# Patient Record
Sex: Male | Born: 1990 | Race: Black or African American | Hispanic: No | Marital: Single | Smoking: Never smoker
Health system: Southern US, Community
[De-identification: ages and names within clinical notes are randomized; demographics above are authoritative.]

## PROBLEM LIST (undated history)

## (undated) DIAGNOSIS — T7840XA Allergy, unspecified, initial encounter: Secondary | ICD-10-CM

## (undated) DIAGNOSIS — Z973 Presence of spectacles and contact lenses: Secondary | ICD-10-CM

## (undated) HISTORY — PX: WISDOM TOOTH EXTRACTION: SHX21

## (undated) HISTORY — DX: Presence of spectacles and contact lenses: Z97.3

## (undated) HISTORY — PX: TYMPANOSTOMY TUBE PLACEMENT: SHX32

## (undated) HISTORY — DX: Allergy, unspecified, initial encounter: T78.40XA

---
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
## 2010-02-13 ENCOUNTER — Ambulatory Visit: Payer: Self-pay

## 2010-02-21 ENCOUNTER — Ambulatory Visit
Admission: RE | Admit: 2010-02-21 | Discharge: 2010-02-21 | Payer: Self-pay | Source: Home / Self Care | Attending: Family Medicine | Admitting: Family Medicine

## 2010-03-22 ENCOUNTER — Encounter: Payer: Self-pay | Admitting: *Deleted

## 2010-03-22 NOTE — Progress Notes (Signed)
Summary: Shot Record  Phone Note Call from Patient Call back at Home Phone [phone]   Caller: mom-Trish Summary of Call: Pt is going to college is he up to date on shots if so can she get a print out of them. Initial call taken by: Clydell Hakim,  September 12, 2009 8:53 AM  Follow-up for Phone Call        message left to call back. patient needs Hep A, Hep B, Menactra, tdap and Varicella if he has never had the disease.  need to schedule nurse visit. Follow-up by: Theresia Lo RN,  September 12, 2009 11:33 AM  Additional Follow-up for Phone Call Additional follow up Details #1::        mother given above message and patient will come in Thursday to update immunizations. mother states he had chicken pox  disease at age 20 . Additional Follow-up by: Theresia Lo RN,  September 12, 2009 4:56 PM

## 2010-03-22 NOTE — Assessment & Plan Note (Signed)
Summary: SHOTS/KH  Nurse Visit   Vital Signs:  Patient profile:   20 year old male Temp:     98.6 degrees F  Vitals Entered By: Theresia Lo RN (February 21, 2010 9:30 AM)  Allergies: No Known Drug Allergies  Immunizations Administered:  Hepatitis B Vaccine # 1:    Vaccine Type: HepB Adolescent    Site: right deltoid    Mfr: Merck    Dose: 0.5 ml    Route: IM    Given by: Theresia Lo RN    Exp. Date: 06/18/2011    Lot #: [id number]    VIS given: 09/04/05 version given February 21, 2010.  Influenza Vaccine # 1:    Vaccine Type: Fluvax 3+    Site: left deltoid    Mfr: GlaxoSmithKline    Dose: 0.5 ml    Route: IM    Given by: Theresia Lo RN    Exp. Date: 08/18/2010    Lot #: [id number]    VIS given: 09/12/09 version given February 21, 2010.  Flu Vaccine Consent Questions:    Do you have a history of severe allergic reactions to this vaccine? no    Any prior history of allergic reactions to egg and/or gelatin? no    Do you have a sensitivity to the preservative Thimersol? no    Do you have a past history of Guillan-Barre Syndrome? no    Do you currently have an acute febrile illness? no    Have you ever had a severe reaction to latex? no    Vaccine information given and explained to patient? yes  Orders Added: 1)  Hepatitis B Vaccine ADOLESCENT (2 dose) [90743] 2)  Admin 1st Vaccine [90471] 3)  Flu Vaccine 53yrs + [14782] 4)  Admin of Any Addtl Vaccine [90472]   Orders Added: 1)  Hepatitis B Vaccine ADOLESCENT (2 dose) [90743] 2)  Admin 1st Vaccine [90471] 3)  Flu Vaccine 19yrs + [95621] 4)  Admin of Any Addtl Vaccine [30865]

## 2010-03-22 NOTE — Assessment & Plan Note (Signed)
Summary: update immunizations/ls  Nurse Visit  Hep B, Hep A Menactra, Tdap given. Entered in [place]. Theresia Lo RN  September 14, 2009 12:31 PM  Vital Signs:  Patient profile:   20 year old male Temp:     98.3 degrees F oral  Vitals Entered By: Theresia Lo RN (September 14, 2009 12:30 PM)  Allergies: No Known Drug Allergies  Orders Added: 1)  Admin 1st Vaccine [facility]) [90471S] 2)  Admin of Any Addtl Vaccine [facility]) [phone]

## 2010-03-22 NOTE — Assessment & Plan Note (Signed)
Summary: STD check   Vital Signs:  Patient profile:   20 year old male Height:      71.25 inches Weight:      170 pounds BMI:     23.63 Temp:     98.6 degrees F oral Pulse rate:   73 / minute BP sitting:   128 / 81  (right arm) Cuff size:   regular  Vitals Entered By: Jimmy Footman, CMA (September 21, 2009 9:58 AM) CC: STD checking Is Patient Diabetic? No Pain Assessment Patient in pain? no      Comments Pt. states that he has no sxs   Primary Care [facility] [place]:  Jamie Brookes MD  CC:  STD checking.  History of Present Illness: STD check: Pt is 20 y/o. He has had 4 sexual partners in his life. He does not always use condoms. He is going off to college in a few weeks and wants to have everything checked for STD's before he goes away to college. He has no h/o STD's.  He smokes occasional MJ, but not cigarettes. Discussed making sure he has all his vaccines before college. He just got them and is up to date.  No penile discharge, no pain, no c/o fevers or chills.   Habits & Providers  Alcohol-Tobacco-Diet     Tobacco Status: never  Exercise-Depression-Behavior     Drug Use: marijuanna  Comments: smokes MJ occasionally  Current Medications (verified): 1)  Zyrtec Allergy 10 Mg Tabs (Cetirizine Hcl) .Marland Kitchen.. 1 Tablet By Mouth At Bedtime As Needed For Allergies 2)  Ranitidine Hcl 150 Mg Caps (Ranitidine Hcl) .Marland Kitchen.. 1 Tablet By Mouth Two Times A Day For Heartburn / Reflux  Allergies (verified): No Known Drug Allergies  Social History: Going to college in [place], [place] soon. Will study business. Performs well in school.  Lives with mom.  Hopes to run Track.  Socially well adapted.  No tobacco or EtOH.  Occasional MJ.  Sexually active with h/o 4 partners. No using protection all the time.Drug Use:  marijuanna  Review of Systems        vitals reviewed and pertinent negatives and positives seen in HPI   Physical Exam  General:  Well-developed,well-nourished,in no acute distress;  alert,appropriate and cooperative throughout examination Genitalia:  Testes bilaterally descended without nodularity, tenderness or masses. No scrotal masses or lesions. No penis lesions or urethral discharge. Psych:  depressed affect and flat affect.     Impression & Recommendations:  Problem # 1:  CONTACT OR EXPOSURE TO OTHER VIRAL DISEASES (ICD-V01.79) Assessment New Pt has had 4 sexual partners. He has not always worn protection. He is here today to get tested for STD's   Orders: HIV-[facility] ([id number]) RPR-[facility] [phone]) [facility]- Est Level  3 (35573)  Complete Medication List: 1)  Zyrtec Allergy 10 Mg Tabs (Cetirizine hcl) .Marland Kitchen.. 1 tablet by mouth at bedtime as needed for allergies 2)  Ranitidine Hcl 150 Mg Caps (Ranitidine hcl) .Marland Kitchen.. 1 tablet by mouth two times a day for heartburn / reflux  Other Orders: GC/Chlamydia-[facility] ([id number]/87491)

## 2011-02-24 ENCOUNTER — Encounter: Payer: Self-pay | Admitting: *Deleted

## 2011-02-24 ENCOUNTER — Emergency Department (HOSPITAL_COMMUNITY)
Admission: EM | Admit: 2011-02-24 | Discharge: 2011-02-24 | Disposition: A | Discharge status: Home / Self Care | Payer: Self-pay | Attending: Emergency Medicine | Admitting: Emergency Medicine

## 2011-02-24 ENCOUNTER — Emergency Department (HOSPITAL_COMMUNITY): Payer: Self-pay

## 2011-02-24 ENCOUNTER — Other Ambulatory Visit: Payer: Self-pay

## 2011-02-24 DIAGNOSIS — M549 Dorsalgia, unspecified: Secondary | ICD-10-CM | POA: Insufficient documentation

## 2011-02-24 DIAGNOSIS — R0789 Other chest pain: Secondary | ICD-10-CM

## 2011-02-24 DIAGNOSIS — R079 Chest pain, unspecified: Secondary | ICD-10-CM | POA: Insufficient documentation

## 2011-02-24 MED ORDER — IBUPROFEN 800 MG PO TABS
800.0000 mg | ORAL_TABLET | Freq: Once | ORAL | Status: AC
  Administered 2011-02-24: 800 mg via ORAL
  Filled 2011-02-24: qty 1

## 2011-02-24 NOTE — ED Notes (Signed)
Cp started last night; difficult to move out of bed. Lt. Side cp. Lt. Shoulder pain.

## 2011-02-24 NOTE — ED Notes (Signed)
Meal tray provided to pt and family per request. Pt continues to rest quietly in bed. Denies pain. NAD noted. Awaiting disposition.

## 2011-02-24 NOTE — ED Provider Notes (Signed)
History     CSN: [id number]  Arrival date & time 02/24/11  4098   First MD Initiated Contact with Patient 02/24/11 1002      Chief Complaint  Patient presents with  . Chest Pain    (Consider location/radiation/quality/duration/timing/severity/associated sxs/prior treatment) Patient is a 21 y.o. male presenting with chest pain. The history is provided by the patient.  Chest Pain   He says that he woke up during the night and noted a sharp left-sided chest pain with some radiation to the back. Pain is worse with movement and slightly worse when he takes a deep breath. He rates the pain at 8/10 while lying still, and 10 out of 10 when he moves it. There is no associated dyspnea, nausea, vomiting, diaphoresis. He denies any recent unusual activity and denies any recent cold symptoms or cough. He denies fever, chills. He has not had similar pain before. Has not taken anything to help the pain.  History reviewed. No pertinent past medical history.  History reviewed. No pertinent past surgical history.  History reviewed. No pertinent family history.  History  Substance Use Topics  . Smoking status: Not on file  . Smokeless tobacco: Not on file  . Alcohol Use: No      Review of Systems  Cardiovascular: Positive for chest pain.  All other systems reviewed and are negative.    Allergies  Review of patient's allergies indicates no known allergies.  Home Medications  No current outpatient prescriptions on file.  BP 134/83  Pulse 85  Temp(Src) 97.8 F (36.6 C) (Oral)  SpO2 99%  Physical Exam  Nursing note and vitals reviewed.  21 year old male who is resting comfortably and in no acute distress. Vital signs are normal. Oxygen saturation is 99% which is normal. Head is normocephalic and atraumatic. PERRLA, EOMI. Neck is supple without adenopathy or JVD. Lungs are clear without rales, wheezes, rhonchi. Back is nontender. Heart has regular rate and rhythm without murmur. There  is moderate left anterior chest wall tenderness which does reproduce his pain. Abdomen is soft, flat, nontender without masses or hepatosplenomegaly. Extremities have no cyanosis, full range of motion present. Skin is warm and moist without rash. Neurologic: Mental status is normal, cranial nerves are intact, there no focal motor or sensory deficits. Psychiatric: No abnormalities of mood or affect.  ED Course  Procedures (including critical care time)  Labs Reviewed - No data to display No results found.  Results for orders placed in visit on 09/21/09  CONVERTED [facility] LAB      Component Value Range   Chlamydia, DNA Probe NEGATIVE  NEGATIVE    GC Probe Amp, Genital NEGATIVE  NEGATIVE    HIV NON REAC  NON REAC    RPR NON REAC  NON REAC    Dg Chest 2 View  02/24/2011  *RADIOLOGY REPORT*  Clinical Data: Chest pain, left shoulder pain  CHEST - 2 VIEW  Comparison: None.  Findings: Slight hyperinflation and minimal central airway thickening.  Prominent lower lobe vascular markings.  No definite pneumonia, collapse, consolidation, effusion, or pneumothorax. Trachea midline.  IMPRESSION: Hyperinflation and central bronchial thickening.  Original Report Authenticated By: Judie Petit. Ruel Favors, M.D.    No diagnosis found.   Date: 02/24/2011  Rate: 70  Rhythm: normal sinus rhythm  QRS Axis: normal  Intervals: normal  ST/T Wave abnormalities: early repolarization  Conduction Disutrbances:none  Narrative Interpretation: Left ventricular hypertrophy with early repolarization changes. No old tracing available for comparison.  Old EKG Reviewed:  none available  He was given a dose of oral ibuprofen with excellent relief of pain. Pain clearly seems to be chest wall related, possibly costochondritis. He will be sent home with instructions to use over-the-counter NSAIDs as needed.  MDM  Chest pain which appears to be musculoskeletal, possibly costochondritis. Chest x-ray will be obtained to rule out pneumonia  and will be given a trial of NSAIDs. Dose of ibuprofen is given in the ED.        Dione Booze, MD 02/24/11 [phone]

## 2013-05-25 ENCOUNTER — Ambulatory Visit (INDEPENDENT_AMBULATORY_CARE_PROVIDER_SITE_OTHER): Payer: 59 | Admitting: Medical

## 2013-05-25 ENCOUNTER — Encounter: Payer: Self-pay | Admitting: Medical

## 2013-05-25 VITALS — BP 110/60 | HR 88 | Temp 97.8°F | Resp 16 | Ht 71.5 in | Wt 174.0 lb

## 2013-05-25 DIAGNOSIS — J309 Allergic rhinitis, unspecified: Secondary | ICD-10-CM

## 2013-05-25 DIAGNOSIS — Z113 Encounter for screening for infections with a predominantly sexual mode of transmission: Secondary | ICD-10-CM

## 2013-05-25 DIAGNOSIS — Z0000 Encounter for general adult medical examination without abnormal findings: Secondary | ICD-10-CM

## 2013-05-25 DIAGNOSIS — Z841 Family history of disorders of kidney and ureter: Secondary | ICD-10-CM

## 2013-05-25 LAB — COMPREHENSIVE METABOLIC PANEL
ALK PHOS: 58 U/L (ref 39–117)
ALT: 18 U/L (ref 0–53)
AST: 16 U/L (ref 0–37)
Albumin: 4.4 g/dL (ref 3.5–5.2)
BILIRUBIN TOTAL: 0.9 mg/dL (ref 0.2–1.2)
BUN: 10 mg/dL (ref 6–23)
CO2: 34 mEq/L — ABNORMAL HIGH (ref 19–32)
CREATININE: 0.97 mg/dL (ref 0.50–1.35)
Calcium: 9.6 mg/dL (ref 8.4–10.5)
Chloride: 101 mEq/L (ref 96–112)
Glucose, Bld: 82 mg/dL (ref 70–99)
Potassium: 4.1 mEq/L (ref 3.5–5.3)
Sodium: 139 mEq/L (ref 135–145)
Total Protein: 7.2 g/dL (ref 6.0–8.3)

## 2013-05-25 LAB — LIPID PANEL
CHOL/HDL RATIO: 2.5 ratio
Cholesterol: 136 mg/dL (ref 0–200)
HDL: 55 mg/dL (ref >39)
LDL Cholesterol: 66 mg/dL (ref 0–99)
TRIGLYCERIDES: 76 mg/dL (ref <150)
VLDL: 15 mg/dL (ref 0–40)

## 2013-05-25 LAB — CBC
HCT: 42.8 % (ref 39.0–52.0)
HEMOGLOBIN: 15 g/dL (ref 13.0–17.0)
MCH: 31.4 pg (ref 26.0–34.0)
MCHC: 35 g/dL (ref 30.0–36.0)
MCV: 89.7 fL (ref 78.0–100.0)
Platelets: 215 10*3/uL (ref 150–400)
RBC: 4.77 MIL/uL (ref 4.22–5.81)
RDW: 12.6 % (ref 11.5–15.5)
WBC: 7 10*3/uL (ref 4.0–10.5)

## 2013-05-25 NOTE — Progress Notes (Signed)
Subjective:   HPI  Connor Maldonado is a 23 y.o. male who presents for a complete physical.  New patient today.  Preventative care: Last ophthalmology visit:yes - [facility] Last dental visit:n/a  Last colonoscopy:n/a Last prostate exam: n/a Last EKG:n/a Last labs: new  Prior vaccinations: TD or Tdap:09/14/2009 Influenza:02/2010  Pneumococcal:n/a Shingles/Zostavax:n/a  Advanced directive:n/a Health care power of attorney:n/a Living will:n/a  Concerns: Thought he saw worm or mucous in stool once few months ago.   No current stool problems, diarrhea, weight loss, fever, or abdominal pain.  Reviewed their medical, surgical, family, social, medication, and allergy history and updated chart as appropriate.  Past Medical History  Diagnosis Date  . Wears contact lenses   . Allergy     Past Surgical History  Procedure Laterality Date  . Wisdom tooth extraction    . Tympanostomy tube placement      childhood    History   Social History  . Marital Status: Single    Spouse Name: N/A    Number of Children: N/A  . Years of Education: N/A   Occupational History  . Not on file.   Social History Main Topics  . Smoking status: Never Smoker   . Smokeless tobacco: Not on file  . Alcohol Use: 1.2 oz/week    2 Shots of liquor per week  . Drug Use: No  . Sexual Activity: Yes     Comment: same partner since high school   Other Topics Concern  . 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      Family History  Problem Relation Age of Onset  . Kidney disease Mother     kidney trasplant  . Diabetes Mother   . Hypertension Father   . Heart disease Maternal Grandfather   . Cancer Maternal Grandfather     prostate  . Stroke Maternal Grandfather   . Heart disease Paternal Grandfather   . Diabetes  Paternal Grandfather     Current outpatient prescriptions:cetirizine (ZYRTEC) 10 MG tablet, Take 10 mg by mouth daily., Disp: , Rfl:   No Known Allergies   Review of Systems Constitutional: -fever, -chills, -sweats, -unexpected weight change, -decreased appetite, -fatigue Allergy: -sneezing, -itching, -congestion Dermatology: -changing moles, --rash, -lumps ENT: -runny nose, -ear pain, -sore throat, -hoarseness, -sinus pain, -teeth pain, - ringing in ears, -hearing loss, -nosebleeds Cardiology: -chest pain, -palpitations, -swelling, -difficulty breathing when lying flat, -waking up short of breath Respiratory: -cough, -shortness of breath, -difficulty breathing with exercise or exertion, -wheezing, -coughing up blood Gastroenterology: -abdominal pain, -nausea, -vomiting, -diarrhea, -constipation, -blood in stool, -changes in bowel movement, -difficulty swallowing or eating Hematology: -bleeding, -bruising  Musculoskeletal: -joint aches, -muscle aches, -joint swelling, -back pain, -neck pain, -cramping, -changes in gait Ophthalmology: denies vision changes, eye redness, itching, discharge Urology: -burning with urination, -difficulty urinating, -blood in urine, -urinary frequency, -urgency, -incontinence Neurology: -headache, -weakness, -tingling, -numbness, -memory loss, -falls, -dizziness Psychology: -depressed mood, -agitation, -sleep problems     Objective:   Physical Exam  BP 110/60  Pulse 88  Temp(Src) 97.8 F (36.6 C) (Oral)  Resp 16  Ht 5' 11.5" (1.816 m)  Wt 174 lb (78.926 kg)  BMI 23.93 kg/m2  General appearance: alert, no distress, WD/WN, lean AA male Skin:scattered benign appearing macules, no worrisome lesions HEENT: normocephalic, conjunctiva/corneas  normal, sclerae anicteric, PERRLA, EOMi, left TM with scar tissue from prior TM tube, nares with turbintaed edema, clear discharge, pharynx normal Oral cavity: MMM, tongue normal, teeth normal Neck: supple, no  lymphadenopathy, no thyromegaly, no masses, normal ROM, no briuts Chest: non tender, normal shape and expansion Heart: RRR, normal S1, S2, no murmurs Lungs: CTA bilaterally, no wheezes, rhonchi, or rales Abdomen: +bs, soft, non tender, non distended, no masses, no hepatomegaly, no splenomegaly, no bruits Back: non tender, normal ROM, no scoliosis Musculoskeletal: upper extremities non tender, no obvious deformity, normal ROM throughout, lower extremities non tender, no obvious deformity, normal ROM throughout Extremities: no edema, no cyanosis, no clubbing Pulses: 2+ symmetric, upper and lower extremities, normal cap refill Neurological: alert, oriented x 3, CN2-12 intact, strength normal upper extremities and lower extremities, sensation normal throughout, DTRs 2+ throughout, no cerebellar signs, gait normal Psychiatric: normal affect, behavior normal, pleasant  GU: normal male external genitalia, circumcised, nontender, no masses, no hernia, no lymphadenopathy Rectal: deferred   Assessment and Plan :      Encounter Diagnoses  Name Primary?  . Routine general medical examination at a health care facility Yes  . Allergic rhinitis   . Screen for STD (sexually transmitted disease)   . Family history of kidney disease     Physical exam - discussed healthy lifestyle, diet, exercise, preventative care, vaccinations, and addressed their concerns.     Specific recommendations today include:  Continue routine exercise, healthy diet  See dentist yearly for routine preventative care  See eye doctor yearly for routine followup  Check your testicles monthly for lumps and bumps for testicular cancer screening  We are checking baseline labs today and we will call with these results  Continue Zyrtec daily at bedtime for allergies, but if this not helping after 1-2 weeks we can consider other options  Try and get me a copy of your college vaccine record  I do recommend updating the HPV  vaccine if you have not had this  Get yearly flu shot in September  If you continue to see abnormal stool findings, then recheck   Follow-up pending labs

## 2013-05-25 NOTE — Patient Instructions (Signed)
Thank you for giving me the opportunity to serve you today.    Your diagnosis today includes: Encounter Diagnoses  Name Primary?  . Routine general medical examination at a health care facility Yes  . Allergic rhinitis   . Screen for STD (sexually transmitted disease)   . Family history of kidney disease      Specific recommendations today include:  Continue routine exercise, healthy diet  See dentist yearly for routine preventative care  See eye doctor yearly for routine followup  Check your testicles monthly for lumps and bumps for testicular cancer screening  We are checking baseline labs today and we will call with these results  Continue Zyrtec daily at bedtime for allergies, but if this not helping after 1-2 weeks we can consider other options  Try and get me a copy of your college vaccine record  I do recommend updating the HPV vaccine if you have not had this  Get yearly flu shot in September  If you continue to see abnormal stool findings, then recheck   Return pending labs.    I have included other useful information below for your review.  Preventative Care for Adults, Male       REGULAR HEALTH EXAMS:  A routine yearly physical is a good way to check in with your primary care provider about your health and preventive screening. It is also an opportunity to share updates about your health and any concerns you have, and receive a thorough all-over exam.   Most health insurance companies pay for at least some preventative services.  Check with your health plan for specific coverages.  WHAT PREVENTATIVE SERVICES DO MEN NEED?  Adult men should have their weight and blood pressure checked regularly.   Men age 20 and older should have their cholesterol levels checked regularly.  Beginning at age 18 and continuing to age 80, men should be screened for colorectal cancer.  Certain people should may need continued testing until age 71.  Other cancer  screening may include exams for testicular and prostate cancer.  Updating vaccinations is part of preventative care.  Vaccinations help protect against diseases such as the flu.  Lab tests are generally done as part of preventative care to screen for anemia and blood disorders, to screen for problems with the kidneys and liver, to screen for bladder problems, to check blood sugar, and to check your cholesterol level.  Preventative services generally include counseling about diet, exercise, avoiding tobacco, drugs, excessive alcohol consumption, and sexually transmitted infections.    GENERAL RECOMMENDATIONS FOR GOOD HEALTH:  Healthy diet:  Eat a variety of foods, including fruit, vegetables, animal or vegetable protein, such as meat, fish, chicken, and eggs, or beans, lentils, tofu, and grains, such as rice.  Drink plenty of water daily.  Decrease saturated fat in the diet, avoid lots of red meat, processed foods, sweets, fast foods, and fried foods.  Exercise:  Aerobic exercise helps maintain good heart health. At least 30-40 minutes of moderate-intensity exercise is recommended. For example, a brisk walk that increases your heart rate and breathing. This should be done on most days of the week.   Find a type of exercise or a variety of exercises that you enjoy so that it becomes a part of your daily life.  Examples are running, walking, swimming, water aerobics, and biking.  For motivation and support, explore group exercise such as aerobic class, spin class, Zumba, Yoga,or  martial arts, etc.    Set exercise goals  for yourself, such as a certain weight goal, walk or run in a race such as a 5k walk/run.  Speak to your primary care provider about exercise goals.  Disease prevention:  If you smoke or chew tobacco, find out from your caregiver how to quit. It can literally save your life, no matter how long you have been a tobacco user. If you do not use tobacco, never begin.   Maintain  a healthy diet and normal weight. Increased weight leads to problems with blood pressure and diabetes.   The Body Mass Index or BMI is a way of measuring how much of your body is fat. Having a BMI above 27 increases the risk of heart disease, diabetes, hypertension, stroke and other problems related to obesity. Your caregiver can help determine your BMI and based on it develop an exercise and dietary program to help you achieve or maintain this important measurement at a healthful level.  High blood pressure causes heart and blood vessel problems.  Persistent high blood pressure should be treated with medicine if weight loss and exercise do not work.   Fat and cholesterol leaves deposits in your arteries that can block them. This causes heart disease and vessel disease elsewhere in your body.  If your cholesterol is found to be high, or if you have heart disease or certain other medical conditions, then you may need to have your cholesterol monitored frequently and be treated with medication.   Ask if you should have a stress test if your history suggests this. A stress test is a test done on a treadmill that looks for heart disease. This test can find disease prior to there being a problem.  Avoid drinking alcohol in excess (more than two drinks per day).  Avoid use of street drugs. Do not share needles with anyone. Ask for professional help if you need assistance or instructions on stopping the use of alcohol, cigarettes, and/or drugs.  Brush your teeth twice a day with fluoride toothpaste, and floss once a day. Good oral hygiene prevents tooth decay and gum disease. The problems can be painful, unattractive, and can cause other health problems. Visit your dentist for a routine oral and dental check up and preventive care every 6-12 months.   Look at your skin regularly.  Use a mirror to look at your back. Notify your caregivers of changes in moles, especially if there are changes in shapes, colors,  a size larger than a pencil eraser, an irregular border, or development of new moles.  Safety:  Use seatbelts 100% of the time, whether driving or as a passenger.  Use safety devices such as hearing protection if you work in environments with loud noise or significant background noise.  Use safety glasses when doing any work that could send debris in to the eyes.  Use a helmet if you ride a bike or motorcycle.  Use appropriate safety gear for contact sports.  Talk to your caregiver about gun safety.  Use sunscreen with a SPF (or skin protection factor) of 15 or greater.  Lighter skinned people are at a greater risk of skin cancer. Don't forget to also wear sunglasses in order to protect your eyes from too much damaging sunlight. Damaging sunlight can accelerate cataract formation.   Practice safe sex. Use condoms. Condoms are used for birth control and to help reduce the spread of sexually transmitted infections (or STIs).  Some of the STIs are gonorrhea (the clap), chlamydia, syphilis, trichomonas, herpes, HPV (human  papilloma virus) and HIV (human immunodeficiency virus) which causes AIDS. The herpes, HIV and HPV are viral illnesses that have no cure. These can result in disability, cancer and death.   Keep carbon monoxide and smoke detectors in your home functioning at all times. Change the batteries every 6 months or use a model that plugs into the wall.   Vaccinations:  Stay up to date with your tetanus shots and other required immunizations. You should have a booster for tetanus every 10 years. Be sure to get your flu shot every year, since 5%-20% of the [place] population comes down with the flu. The flu vaccine changes each year, so being vaccinated once is not enough. Get your shot in the fall, before the flu season peaks.   Other vaccines to consider:  Pneumococcal vaccine to protect against certain types of pneumonia.  This is normally recommended for adults age 41 or older.  However, adults  younger than 23 years old with certain underlying conditions such as diabetes, heart or lung disease should also receive the vaccine.  Shingles vaccine to protect against Varicella Zoster if you are older than age 51, or younger than 23 years old with certain underlying illness.  Hepatitis A vaccine to protect against a form of infection of the liver by a virus acquired from food.  Hepatitis B vaccine to protect against a form of infection of the liver by a virus acquired from blood or body fluids, particularly if you work in health care.  If you plan to travel internationally, check with your local health department for specific vaccination recommendations.  Cancer Screening:  Most routine colon cancer screening begins at the age of 34. On a yearly basis, doctors may provide special easy to use take-home tests to check for hidden blood in the stool. Sigmoidoscopy or colonoscopy can detect the earliest forms of colon cancer and is life saving. These tests use a small camera at the end of a tube to directly examine the colon. Speak to your caregiver about this at age 37, when routine screening begins (and is repeated every 5 years unless early forms of pre-cancerous polyps or small growths are found).   At the age of 62 men usually start screening for prostate cancer every year. Screening may begin at a younger age for those with higher risk. Those at higher risk include African-Americans or having a family history of prostate cancer. There are two types of tests for prostate cancer:   Prostate-specific antigen (PSA) testing. Recent studies raise questions about prostate cancer using PSA and you should discuss this with your caregiver.   Digital rectal exam (in which your doctor's lubricated and gloved finger feels for enlargement of the prostate through the anus).   Screening for testicular cancer.  Do a monthly exam of your testicles. Gently roll each testicle between your thumb and fingers,  feeling for any abnormal lumps. The best time to do this is after a hot shower or bath when the tissues are looser. Notify your caregivers of any lumps, tenderness or changes in size or shape immediately.

## 2013-05-26 LAB — HIV ANTIBODY (ROUTINE TESTING W REFLEX): HIV 1&2 Ab, 4th Generation: NONREACTIVE

## 2013-05-26 LAB — GC/CHLAMYDIA PROBE AMP
CT Probe RNA: NEGATIVE
GC Probe RNA: NEGATIVE

## 2013-05-26 LAB — RPR

## 2013-10-11 ENCOUNTER — Emergency Department (HOSPITAL_COMMUNITY)
Admission: EM | Admit: 2013-10-11 | Discharge: 2013-10-11 | Disposition: A | Discharge status: Home / Self Care | Payer: 59 | Attending: Emergency Medicine | Admitting: Emergency Medicine

## 2013-10-11 ENCOUNTER — Encounter (HOSPITAL_COMMUNITY): Payer: Self-pay | Admitting: Emergency Medicine

## 2013-10-11 DIAGNOSIS — S199XXA Unspecified injury of neck, initial encounter: Secondary | ICD-10-CM

## 2013-10-11 DIAGNOSIS — Y9389 Activity, other specified: Secondary | ICD-10-CM | POA: Diagnosis not present

## 2013-10-11 DIAGNOSIS — Z79899 Other long term (current) drug therapy: Secondary | ICD-10-CM | POA: Diagnosis not present

## 2013-10-11 DIAGNOSIS — Y92410 Unspecified street and highway as the place of occurrence of the external cause: Secondary | ICD-10-CM | POA: Diagnosis not present

## 2013-10-11 DIAGNOSIS — S0993XA Unspecified injury of face, initial encounter: Secondary | ICD-10-CM | POA: Diagnosis not present

## 2013-10-11 DIAGNOSIS — S0990XA Unspecified injury of head, initial encounter: Secondary | ICD-10-CM | POA: Insufficient documentation

## 2013-10-11 MED ORDER — HYDROCODONE-ACETAMINOPHEN 5-325 MG PO TABS: 1.0000 | ORAL_TABLET | Freq: Four times a day (QID) | ORAL | Status: DC | PRN

## 2013-10-11 NOTE — Discharge Instructions (Signed)
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
## 2013-10-12 NOTE — ED Provider Notes (Signed)
Medical screening examination/treatment/procedure(s) were performed by non-physician practitioner and as supervising physician I was immediately available for consultation/collaboration.   EKG Interpretation None        Mirian Mo, MD 10/12/13 0120

## 2013-12-20 ENCOUNTER — Emergency Department (HOSPITAL_COMMUNITY)
Admission: EM | Admit: 2013-12-20 | Discharge: 2013-12-20 | Disposition: A | Discharge status: Home / Self Care | Payer: No Typology Code available for payment source | Attending: Emergency Medicine | Admitting: Emergency Medicine

## 2013-12-20 ENCOUNTER — Emergency Department (HOSPITAL_COMMUNITY): Payer: No Typology Code available for payment source

## 2013-12-20 ENCOUNTER — Encounter (HOSPITAL_COMMUNITY): Payer: Self-pay

## 2013-12-20 DIAGNOSIS — Y9389 Activity, other specified: Secondary | ICD-10-CM | POA: Insufficient documentation

## 2013-12-20 DIAGNOSIS — Y92410 Unspecified street and highway as the place of occurrence of the external cause: Secondary | ICD-10-CM | POA: Diagnosis not present

## 2013-12-20 DIAGNOSIS — S6992XA Unspecified injury of left wrist, hand and finger(s), initial encounter: Secondary | ICD-10-CM | POA: Diagnosis present

## 2013-12-20 DIAGNOSIS — S63502A Unspecified sprain of left wrist, initial encounter: Secondary | ICD-10-CM | POA: Diagnosis not present

## 2013-12-20 DIAGNOSIS — S60511A Abrasion of right hand, initial encounter: Secondary | ICD-10-CM | POA: Insufficient documentation

## 2013-12-20 DIAGNOSIS — T148XXA Other injury of unspecified body region, initial encounter: Secondary | ICD-10-CM

## 2013-12-20 MED ORDER — IBUPROFEN 600 MG PO TABS: 600.0000 mg | ORAL_TABLET | Freq: Four times a day (QID) | ORAL | Status: DC | PRN

## 2013-12-20 MED ORDER — IBUPROFEN 200 MG PO TABS
600.0000 mg | ORAL_TABLET | Freq: Once | ORAL | Status: AC
  Administered 2013-12-20: 600 mg via ORAL
  Filled 2013-12-20: qty 3

## 2013-12-20 NOTE — ED Notes (Signed)
Patient called to treatment room x1, no answer.  
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
## 2013-12-20 NOTE — Discharge Instructions (Signed)
Abrasion An abrasion is a cut or scrape of the skin. Abrasions do not extend through all layers of the skin and most heal within 10 days. It is important to care for your abrasion properly to prevent infection. CAUSES  Most abrasions are caused by falling on, or gliding across, the ground or other surface. When your skin rubs on something, the outer and inner layer of skin rubs off, causing an abrasion. DIAGNOSIS  Your caregiver will be able to diagnose an abrasion during a physical exam.  TREATMENT  Your treatment depends on how large and deep the abrasion is. Generally, your abrasion will be cleaned with water and a mild soap to remove any dirt or debris. An antibiotic ointment may be put over the abrasion to prevent an infection. A bandage (dressing) may be wrapped around the abrasion to keep it from getting dirty.  You may need a tetanus shot if:  You cannot remember when you had your last tetanus shot.  You have never had a tetanus shot.  The injury broke your skin. If you get a tetanus shot, your arm may swell, get red, and feel warm to the touch. This is common and not a problem. If you need a tetanus shot and you choose not to have one, there is a rare chance of getting tetanus. Sickness from tetanus can be serious.  HOME CARE INSTRUCTIONS   If a dressing was applied, change it at least once a day or as directed by your caregiver. If the bandage sticks, soak it off with warm water.   Wash the area with water and a mild soap to remove all the ointment 2 times a day. Rinse off the soap and pat the area dry with a clean towel.   Reapply any ointment as directed by your caregiver. This will help prevent infection and keep the bandage from sticking. Use gauze over the wound and under the dressing to help keep the bandage from sticking.   Change your dressing right away if it becomes wet or dirty.   Only take over-the-counter or prescription medicines for pain, discomfort, or fever as  directed by your caregiver.   Follow up with your caregiver within 24-48 hours for a wound check, or as directed. If you were not given a wound-check appointment, look closely at your abrasion for redness, swelling, or pus. These are signs of infection. SEEK IMMEDIATE MEDICAL CARE IF:   You have increasing pain in the wound.   You have redness, swelling, or tenderness around the wound.   You have pus coming from the wound.   You have a fever or persistent symptoms for more than 2-3 days.  You have a fever and your symptoms suddenly get worse.  You have a bad smell coming from the wound or dressing.  MAKE SURE YOU:   Understand these instructions.  Will watch your condition.  Will get help right away if you are not doing well or get worse. Document Released: 11/14/2004 Document Revised: 01/22/2012 Document Reviewed: 01/08/2011 [facility] Patient Information 2015 [place], [place]. This information is not intended to replace advice given to you by your health care provider. Make sure you discuss any questions you have with your health care provider.  Cryotherapy Cryotherapy means treatment with cold. Ice or gel packs can be used to reduce both pain and swelling. Ice is the most helpful within the first 24 to 48 hours after an injury or flare-up from overusing a muscle or joint. Sprains, strains, spasms,  burning pain, shooting pain, and aches can all be eased with ice. Ice can also be used when recovering from surgery. Ice is effective, has very few side effects, and is safe for most people to use. PRECAUTIONS  Ice is not a safe treatment option for people with:  Raynaud phenomenon. This is a condition affecting small blood vessels in the extremities. Exposure to cold may cause your problems to return.  Cold hypersensitivity. There are many forms of cold hypersensitivity, including:  Cold urticaria. Red, itchy hives appear on the skin when the tissues begin to warm after being  iced.  Cold erythema. This is a red, itchy rash caused by exposure to cold.  Cold hemoglobinuria. Red blood cells break down when the tissues begin to warm after being iced. The hemoglobin that carry oxygen are passed into the urine because they cannot combine with blood proteins fast enough.  Numbness or altered sensitivity in the area being iced. If you have any of the following conditions, do not use ice until you have discussed cryotherapy with your caregiver:  Heart conditions, such as arrhythmia, angina, or chronic heart disease.  High blood pressure.  Healing wounds or open skin in the area being iced.  Current infections.  Rheumatoid arthritis.  Poor circulation.  Diabetes. Ice slows the blood flow in the region it is applied. This is beneficial when trying to stop inflamed tissues from spreading irritating chemicals to surrounding tissues. However, if you expose your skin to cold temperatures for too long or without the proper protection, you can damage your skin or nerves. Watch for signs of skin damage due to cold. HOME CARE INSTRUCTIONS Follow these tips to use ice and cold packs safely.  Place a dry or damp towel between the ice and skin. A damp towel will cool the skin more quickly, so you may need to shorten the time that the ice is used.  For a more rapid response, add gentle compression to the ice.  Ice for no more than 10 to 20 minutes at a time. The bonier the area you are icing, the less time it will take to get the benefits of ice.  Check your skin after 5 minutes to make sure there are no signs of a poor response to cold or skin damage.  Rest 20 minutes or more between uses.  Once your skin is numb, you can end your treatment. You can test numbness by very lightly touching your skin. The touch should be so light that you do not see the skin dimple from the pressure of your fingertip. When using ice, most people will feel these normal sensations in this order:  cold, burning, aching, and numbness.  Do not use ice on someone who cannot communicate their responses to pain, such as small children or people with dementia. HOW TO MAKE AN ICE PACK Ice packs are the most common way to use ice therapy. Other methods include ice massage, ice baths, and cryosprays. Muscle creams that cause a cold, tingly feeling do not offer the same benefits that ice offers and should not be used as a substitute unless recommended by your caregiver. To make an ice pack, do one of the following:  Place crushed ice or a bag of frozen vegetables in a sealable plastic bag. Squeeze out the excess air. Place this bag inside another plastic bag. Slide the bag into a pillowcase or place a damp towel between your skin and the bag.  Mix 3 parts water with  1 part rubbing alcohol. Freeze the mixture in a sealable plastic bag. When you remove the mixture from the freezer, it will be slushy. Squeeze out the excess air. Place this bag inside another plastic bag. Slide the bag into a pillowcase or place a damp towel between your skin and the bag. SEEK MEDICAL CARE IF:  You develop white spots on your skin. This may give the skin a blotchy (mottled) appearance.  Your skin turns blue or pale.  Your skin becomes waxy or hard.  Your swelling gets worse. MAKE SURE YOU:   Understand these instructions.  Will watch your condition.  Will get help right away if you are not doing well or get worse. Document Released: 10/01/2010 Document Revised: 06/21/2013 Document Reviewed: 10/01/2010 [facility] Patient Information 2015 [place], [place]. This information is not intended to replace advice given to you by your health care provider. Make sure you discuss any questions you have with your health care provider. Your xray is negative for fracture  You have been placed in a splint that you can wear for the next several days

## 2013-12-20 NOTE — ED Notes (Signed)
Ortho notified of pending orders 
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
## 2014-09-15 ENCOUNTER — Emergency Department (HOSPITAL_COMMUNITY)
Admission: EM | Admit: 2014-09-15 | Discharge: 2014-09-15 | Disposition: A | Discharge status: Home / Self Care | Payer: 59 | Attending: Emergency Medicine | Admitting: Emergency Medicine

## 2014-09-15 ENCOUNTER — Encounter (HOSPITAL_COMMUNITY): Payer: Self-pay | Admitting: Emergency Medicine

## 2014-09-15 DIAGNOSIS — R339 Retention of urine, unspecified: Secondary | ICD-10-CM | POA: Insufficient documentation

## 2014-09-15 DIAGNOSIS — R110 Nausea: Secondary | ICD-10-CM | POA: Diagnosis not present

## 2014-09-15 DIAGNOSIS — K5900 Constipation, unspecified: Secondary | ICD-10-CM | POA: Diagnosis not present

## 2014-09-15 DIAGNOSIS — R338 Other retention of urine: Secondary | ICD-10-CM

## 2014-09-15 DIAGNOSIS — R1031 Right lower quadrant pain: Secondary | ICD-10-CM | POA: Diagnosis not present

## 2014-09-15 LAB — URINALYSIS, ROUTINE W REFLEX MICROSCOPIC
Glucose, UA: NEGATIVE mg/dL
Hgb urine dipstick: NEGATIVE
Ketones, ur: 15 mg/dL — AB
Leukocytes, UA: NEGATIVE
Nitrite: NEGATIVE
Protein, ur: NEGATIVE mg/dL
SPECIFIC GRAVITY, URINE: 1.034 — AB (ref 1.005–1.030)
UROBILINOGEN UA: 1 mg/dL (ref 0.0–1.0)
pH: 5.5 (ref 5.0–8.0)

## 2014-09-15 LAB — CBC WITH DIFFERENTIAL/PLATELET
Basophils Absolute: 0.1 10*3/uL (ref 0.0–0.1)
Basophils Relative: 1 % (ref 0–1)
EOS ABS: 0.1 10*3/uL (ref 0.0–0.7)
Eosinophils Relative: 2 % (ref 0–5)
HCT: 41.8 % (ref 39.0–52.0)
HEMOGLOBIN: 14.2 g/dL (ref 13.0–17.0)
Lymphocytes Relative: 44 % (ref 12–46)
Lymphs Abs: 1.6 10*3/uL (ref 0.7–4.0)
MCH: 31.1 pg (ref 26.0–34.0)
MCHC: 34 g/dL (ref 30.0–36.0)
MCV: 91.5 fL (ref 78.0–100.0)
Monocytes Absolute: 0.4 10*3/uL (ref 0.1–1.0)
Monocytes Relative: 10 % (ref 3–12)
Neutro Abs: 1.6 10*3/uL — ABNORMAL LOW (ref 1.7–7.7)
Neutrophils Relative %: 43 % (ref 43–77)
Platelets: 185 10*3/uL (ref 150–400)
RBC: 4.57 MIL/uL (ref 4.22–5.81)
RDW: 12.8 % (ref 11.5–15.5)
WBC: 3.8 10*3/uL — AB (ref 4.0–10.5)

## 2014-09-15 LAB — COMPREHENSIVE METABOLIC PANEL
ALK PHOS: 48 U/L (ref 38–126)
ALT: 17 U/L (ref 17–63)
AST: 23 U/L (ref 15–41)
Albumin: 4.3 g/dL (ref 3.5–5.0)
Anion gap: 7 (ref 5–15)
BUN: 7 mg/dL (ref 6–20)
CALCIUM: 9.3 mg/dL (ref 8.9–10.3)
CHLORIDE: 106 mmol/L (ref 101–111)
CO2: 28 mmol/L (ref 22–32)
Creatinine, Ser: 1.19 mg/dL (ref 0.61–1.24)
GFR calc Af Amer: >60 mL/min (ref >60)
GFR calc non Af Amer: >60 mL/min (ref >60)
GLUCOSE: 91 mg/dL (ref 65–99)
POTASSIUM: 3.8 mmol/L (ref 3.5–5.1)
SODIUM: 141 mmol/L (ref 135–145)
Total Bilirubin: 1.7 mg/dL — ABNORMAL HIGH (ref 0.3–1.2)
Total Protein: 7.1 g/dL (ref 6.5–8.1)

## 2014-09-15 LAB — LIPASE, BLOOD: Lipase: 19 U/L — ABNORMAL LOW (ref 22–51)

## 2014-09-15 MED ORDER — SODIUM CHLORIDE 0.9 % IV BOLUS (SEPSIS)
1000.0000 mL | Freq: Once | INTRAVENOUS | Status: AC
  Administered 2014-09-15: 1000 mL via INTRAVENOUS

## 2014-09-15 MED ORDER — ONDANSETRON HCL 4 MG/2ML IJ SOLN
4.0000 mg | Freq: Once | INTRAMUSCULAR | Status: DC
  Filled 2014-09-15: qty 2

## 2014-09-15 MED ORDER — MORPHINE SULFATE 4 MG/ML IJ SOLN
4.0000 mg | Freq: Once | INTRAMUSCULAR | Status: DC
  Filled 2014-09-15: qty 1

## 2014-09-15 NOTE — ED Notes (Signed)
Patient refused zofran and morphine.

## 2014-09-15 NOTE — Discharge Instructions (Signed)
Acute Urinary Retention The hospital will call you in a few days with STD results if they are positive. Follow up with your primary care physician. Return for any fever, or urinary retention. Acute urinary retention is the temporary inability to urinate. This is a common problem in older men. As men age their prostates become larger and block the flow of urine from the bladder. This is usually a problem that has come on gradually.  HOME CARE INSTRUCTIONS If you are sent home with a Foley catheter and a drainage system, you will need to discuss the best course of action with your health care provider. While the catheter is in, maintain a good intake of fluids. Keep the drainage bag emptied and lower than your catheter. This is so that contaminated urine will not flow back into your bladder, which could lead to a urinary tract infection. There are two main types of drainage bags. One is a large bag that usually is used at night. It has a good capacity that will allow you to sleep through the night without having to empty it. The second type is called a leg bag. It has a smaller capacity, so it needs to be emptied more frequently. However, the main advantage is that it can be attached by a leg strap and can go underneath your clothing, allowing you the freedom to move about or leave your home. Only take over-the-counter or prescription medicines for pain, discomfort, or fever as directed by your health care provider.  SEEK MEDICAL CARE IF:  You develop a low-grade fever.  You experience spasms or leakage of urine with the spasms. SEEK IMMEDIATE MEDICAL CARE IF:   You develop chills or fever.  Your catheter stops draining urine.  Your catheter falls out.  You start to develop increased bleeding that does not respond to rest and increased fluid intake. MAKE SURE YOU:  Understand these instructions.  Will watch your condition.  Will get help right away if you are not doing well or get  worse. Document Released: 05/13/2000 Document Revised: 02/09/2013 Document Reviewed: 07/16/2012 [facility] Patient Information 2015 [place], [place]. This information is not intended to replace advice given to you by your health care provider. Make sure you discuss any questions you have with your health care provider.

## 2014-09-15 NOTE — ED Provider Notes (Signed)
Medical screening examination/treatment/procedure(s) were conducted as a shared visit with non-physician practitioner(s) and myself.  I personally evaluated the patient during the encounter.  24 year old male with 2-3 days of decreased urine output. Also with little suprapubic pain. Has had this before and was related dehydration. Is constipated as well. However rectal exam was negative for any prostate tenderness or bogginess or fluctuance, testicular exam normal. Abdominal exam was benign without any evidence of peritonitis. Patient is well-appearing vital signs are normal. Plan is to check urine and for STDs and BUN and creatinine to ensure no obstructive nephropathy present. Unsure cause of symptoms however if all these are normal patient will likely be discharged follow up with primary doctor.   EKG Interpretation None        Marily Memos, MD 09/15/14 2045

## 2014-09-15 NOTE — ED Provider Notes (Signed)
CSN: [id number]     Arrival date & time 09/15/14  0901 History   First MD Initiated Contact with Patient 09/15/14 [phone]     Chief Complaint  Patient presents with  . Urinary Retention     (Consider location/radiation/quality/duration/timing/severity/associated sxs/prior Treatment) HPI Connor Maldonado is a 24 year old male who presents for abdominal pain and urinary retention for the past 5 days. He states he is able to urinate but it is not his usual flow. He states the abdominal pain began in the epigastrium and has now moved to the right lower quadrant. Associated symptoms are nausea and constipation. He states his rectum is painful during bowel movements. His last bowel movement was 2 days ago. He states he normally has a bowel movement multiple times a day. He also states that he had this once before after drinking liquor. He admits to drinking liquor on Saturday night before this began. He denies any drug use. He states his pain is 6 out of 10 now. He denies any fever, chills, chest pain, shortness of breath, vomiting, diarrhea, hematochezia, penile discharge, hematuria, dysuria. He denies being sexually active. He states his sexual orientation is heterosexual. He denies any previous history of STDs. Past Medical History  Diagnosis Date  . Wears contact lenses   . Allergy    Past Surgical History  Procedure Laterality Date  . Wisdom tooth extraction    . Tympanostomy tube placement      childhood   Family History  Problem Relation Age of Onset  . Kidney disease Mother     kidney trasplant  . Diabetes Mother   . Hypertension Father   . Heart disease Maternal Grandfather   . Cancer Maternal Grandfather     prostate  . Stroke Maternal Grandfather   . Heart disease Paternal Grandfather   . Diabetes Paternal Grandfather    History  Substance Use Topics  . Smoking status: Never Smoker   . Smokeless tobacco: Not on file  . Alcohol Use: 1.2 oz/week    2 Shots of liquor per week      Review of Systems  Constitutional: Negative for fever and chills.  Gastrointestinal: Positive for nausea, abdominal pain and constipation. Negative for vomiting, diarrhea and blood in stool.  Genitourinary: Positive for decreased urine volume. Negative for dysuria, flank pain, discharge, penile swelling, scrotal swelling, penile pain and testicular pain.  All other systems reviewed and are negative.     Allergies  Review of patient's allergies indicates no known allergies.  Home Medications   Prior to Admission medications   Medication Sig Start Date End Date Taking? Authorizing Provider  HYDROcodone-acetaminophen (NORCO/VICODIN) 5-325 MG per tablet Take 1-2 tablets by mouth every 6 (six) hours as needed for moderate pain or severe pain. 10/11/13   Roxy Horseman, PA-C  ibuprofen (ADVIL,MOTRIN) 600 MG tablet Take 1 tablet (600 mg total) by mouth every 6 (six) hours as needed. 12/20/13   Earley Favor, NP   BP 125/69 mmHg  Pulse 86  Temp(Src) 98 F (36.7 C) (Oral)  Resp 16  Ht 6' (1.829 m)  Wt 155 lb (70.308 kg)  BMI 21.02 kg/m2  SpO2 100% Physical Exam  Constitutional: He is oriented to person, place, and time. He appears well-developed and well-nourished.  HENT:  Head: Normocephalic and atraumatic.  Eyes: Conjunctivae are normal.  Neck: Neck supple.  Cardiovascular: Normal rate, regular rhythm and normal heart sounds.   Pulmonary/Chest: Effort normal and breath sounds normal. No respiratory distress. He has no wheezes.  He has no rales.  Abdominal: Soft. Normal appearance. He exhibits no distension and no mass. There is tenderness in the right lower quadrant. There is no rigidity, no rebound, no guarding and no CVA tenderness.    Mild right abdominal tenderness to palpation. No rebound or guarding. No CVA tenderness to palpation.  Genitourinary: Testes normal and penis normal. Circumcised.  GU exam: Chaperone present. No penile discharge or tenderness. No swelling or  tenderness to the testicles. Urethral swab was obtained.  Rectal exam: No tenderness or bogginess to the prostate. No rectal bleeding.  Musculoskeletal: Normal range of motion.  Neurological: He is alert and oriented to person, place, and time.  Skin: Skin is warm and dry.  Psychiatric: He has a normal mood and affect. His behavior is normal.    ED Course  Procedures (including critical care time) Labs Review Labs Reviewed  URINALYSIS, ROUTINE W REFLEX MICROSCOPIC (NOT AT [facility]) - Abnormal; Notable for the following:    Specific Gravity, Urine 1.034 (*)    Bilirubin Urine SMALL (*)    Ketones, ur 15 (*)    All other components within normal limits  CBC WITH DIFFERENTIAL/PLATELET - Abnormal; Notable for the following:    WBC 3.8 (*)    Neutro Abs 1.6 (*)    All other components within normal limits  COMPREHENSIVE METABOLIC PANEL - Abnormal; Notable for the following:    Total Bilirubin 1.7 (*)    All other components within normal limits  LIPASE, BLOOD - Abnormal; Notable for the following:    Lipase 19 (*)    All other components within normal limits  GC/CHLAMYDIA PROBE AMP ([place]) NOT AT [facility]    Imaging Review No results found.   EKG Interpretation None      MDM   Final diagnoses:  Acute urinary retention   Patient presents for decreased urinary flow and abdominal pain. He has no signs of prostatitis. This could be due to STDs. I have not treated the patient since he is able to urinate. He refused pain medications or nausea medication at this time. He has no UTI. His labs are unremarkable. His I did discuss that GC chlamydia results would take 4-5 days in the hospital would call him with the results if they were positive. I also discussed that they would treat him at that time if they were positive. I gave the patient return precautions. He can follow up with his PCP in 2 days. Patient verbally agrees with the plan.    Catha Gosselin, PA-C 09/15/14  1701  Marily Memos, MD 09/15/14 1734

## 2014-09-15 NOTE — ED Notes (Signed)
PA at bedside.

## 2014-09-15 NOTE — ED Notes (Signed)
Onset 4-5 days ago states when urinating does not come out full strength and feels like not draining bladder completely. States feels constipated last BM normal one day ago. General abdominal pain 6/10 dull.

## 2014-09-16 ENCOUNTER — Ambulatory Visit (INDEPENDENT_AMBULATORY_CARE_PROVIDER_SITE_OTHER): Payer: 59 | Admitting: Medical

## 2014-09-16 ENCOUNTER — Encounter: Payer: Self-pay | Admitting: Medical

## 2014-09-16 VITALS — BP 100/70 | HR 60 | Temp 97.5°F | Resp 16 | Wt 154.0 lb

## 2014-09-16 DIAGNOSIS — K5900 Constipation, unspecified: Secondary | ICD-10-CM

## 2014-09-16 DIAGNOSIS — E860 Dehydration: Secondary | ICD-10-CM | POA: Diagnosis not present

## 2014-09-16 DIAGNOSIS — R339 Retention of urine, unspecified: Secondary | ICD-10-CM | POA: Diagnosis not present

## 2014-09-16 LAB — GC/CHLAMYDIA PROBE AMP (~~LOC~~) NOT AT ARMC
CHLAMYDIA, DNA PROBE: NEGATIVE
Neisseria Gonorrhea: NEGATIVE

## 2014-09-16 MED ORDER — TAMSULOSIN HCL 0.4 MG PO CAPS: 0.4000 mg | ORAL_CAPSULE | Freq: Every day | ORAL | Status: DC

## 2014-09-16 NOTE — Progress Notes (Signed)
Subjective: Here for hospital f/u.  Went to the emergency dept yesterday for urinary retention .  Had labs, prostate exam, and IV fluids, was advised that he was dehydrated from drinking too much alcohol which was likely cause of the urinary retention.  He note no prior similar problem.   No prior prostate issue or UTI.  No recent injury or trauma.  He had been drinking several alcoholic drinks the day before the symptoms.  Denies penile discharge, urinary burning or blood, no fever.  He does have problems with bloating, constipation, hard stool.  No other aggravating or relieving factors. No other complaint.  Past Medical History  Diagnosis Date  . Wears contact lenses   . Allergy    ROS as in subjective   Objective: BP 100/70 mmHg  Pulse 60  Temp(Src) 97.5 F (36.4 C) (Tympanic)  Resp 16  Wt 154 lb (69.854 kg)  General appearance: alert, no distress, WD/WN Neck: supple, no lymphadenopathy, no thyromegaly, no masses Heart: RRR, normal S1, S2, no murmurs Lungs: CTA bilaterally, no wheezes, rhonchi, or rales Abdomen: +bs, soft, non tender, non distended, no masses, no hepatomegaly, no splenomegaly Pulses: 2+ symmetric, upper and lower extremities, normal cap refill GU deferred  Results for orders placed or performed during the hospital encounter of 09/15/14 (from the past 48 hour(s))  GC/Chlamydia probe amp ([place])not at [facility]     Status: None   Collection Time: 09/15/14 12:00 AM  Result Value Ref Range   Chlamydia Negative     Comment: Normal Reference Range - Negative   Neisseria gonorrhea Negative     Comment: Normal Reference Range - Negative  Urinalysis, Routine w reflex microscopic (not at [facility])     Status: Abnormal   Collection Time: 09/15/14  9:53 AM  Result Value Ref Range   Color, Urine YELLOW YELLOW   APPearance CLEAR CLEAR   Specific Gravity, Urine 1.034 (H) 1.005 - 1.030   pH 5.5 5.0 - 8.0   Glucose, UA NEGATIVE NEGATIVE mg/dL   Hgb urine dipstick  NEGATIVE NEGATIVE   Bilirubin Urine SMALL (A) NEGATIVE   Ketones, ur 15 (A) NEGATIVE mg/dL   Protein, ur NEGATIVE NEGATIVE mg/dL   Urobilinogen, UA 1.0 0.0 - 1.0 mg/dL   Nitrite NEGATIVE NEGATIVE   Leukocytes, UA NEGATIVE NEGATIVE    Comment: MICROSCOPIC NOT DONE ON URINES WITH NEGATIVE PROTEIN, BLOOD, LEUKOCYTES, NITRITE, OR GLUCOSE <1000 mg/dL.  CBC with Differential     Status: Abnormal   Collection Time: 09/15/14 10:10 AM  Result Value Ref Range   WBC 3.8 (L) 4.0 - 10.5 K/uL   RBC 4.57 4.22 - 5.81 MIL/uL   Hemoglobin 14.2 13.0 - 17.0 g/dL   HCT 41.8 39.0 - 52.0 %   MCV 91.5 78.0 - 100.0 fL   MCH 31.1 26.0 - 34.0 pg   MCHC 34.0 30.0 - 36.0 g/dL   RDW 12.8 11.5 - 15.5 %   Platelets 185 150 - 400 K/uL   Neutrophils Relative % 43 43 - 77 %   Neutro Abs 1.6 (L) 1.7 - 7.7 K/uL   Lymphocytes Relative 44 12 - 46 %   Lymphs Abs 1.6 0.7 - 4.0 K/uL   Monocytes Relative 10 3 - 12 %   Monocytes Absolute 0.4 0.1 - 1.0 K/uL   Eosinophils Relative 2 0 - 5 %   Eosinophils Absolute 0.1 0.0 - 0.7 K/uL   Basophils Relative 1 0 - 1 %   Basophils Absolute 0.1  0.0 - 0.1 K/uL  Comprehensive metabolic panel     Status: Abnormal   Collection Time: 09/15/14 10:10 AM  Result Value Ref Range   Sodium 141 135 - 145 mmol/L   Potassium 3.8 3.5 - 5.1 mmol/L   Chloride 106 101 - 111 mmol/L   CO2 28 22 - 32 mmol/L   Glucose, Bld 91 65 - 99 mg/dL   BUN 7 6 - 20 mg/dL   Creatinine, Ser 1.19 0.61 - 1.24 mg/dL   Calcium 9.3 8.9 - 10.3 mg/dL   Total Protein 7.1 6.5 - 8.1 g/dL   Albumin 4.3 3.5 - 5.0 g/dL   AST 23 15 - 41 U/L   ALT 17 17 - 63 U/L   Alkaline Phosphatase 48 38 - 126 U/L   Total Bilirubin 1.7 (H) 0.3 - 1.2 mg/dL   GFR calc non Af Amer >60 >60 mL/min   GFR calc Af Amer >60 >60 mL/min    Comment: (NOTE) The eGFR has been calculated using the CKD EPI equation. This calculation has not been validated in all clinical situations. eGFR's persistently <60 mL/min signify possible Chronic  Kidney Disease.    Anion gap 7 5 - 15  Lipase, blood     Status: Abnormal   Collection Time: 09/15/14 10:10 AM  Result Value Ref Range   Lipase 19 (L) 22 - 51 U/L      Assessment: Encounter Diagnoses  Name Primary?  . Urinary retention Yes  . Constipation, unspecified constipation type   . Dehydration     Plan: Reviewed ED report, labs.  Discussed symptoms, possible causes, treatment recommendations  F/u 2wk.   Patient Instructions  Recommendations  Stop alcohol for now, and limit this in the future  Get at least 64 oz water daily  Take OTC Stool Softener to help with bowel movements  Begin trial of Flomax medication once daily for the next week  Recheck in 2 weeks, but call sooner if fever, nausea, feeling sick

## 2014-09-16 NOTE — Patient Instructions (Signed)
Recommendations  Stop alcohol for now, and limit this in the future  Get at least 64 oz water daily  Take OTC Stool Softener to help with bowel movements  Begin trial of Flomax medication once daily for the next week  Recheck in 2 weeks, but call sooner if fever, nausea, feeling sick

## 2014-10-03 ENCOUNTER — Encounter: Payer: Self-pay | Admitting: Medical

## 2014-10-03 ENCOUNTER — Ambulatory Visit (INDEPENDENT_AMBULATORY_CARE_PROVIDER_SITE_OTHER): Payer: 59 | Admitting: Medical

## 2014-10-03 VITALS — BP 110/74 | HR 75 | Resp 18 | Ht 72.0 in | Wt 157.0 lb

## 2014-10-03 DIAGNOSIS — Z113 Encounter for screening for infections with a predominantly sexual mode of transmission: Secondary | ICD-10-CM | POA: Diagnosis not present

## 2014-10-03 DIAGNOSIS — Z0000 Encounter for general adult medical examination without abnormal findings: Secondary | ICD-10-CM | POA: Diagnosis not present

## 2014-10-03 DIAGNOSIS — R196 Halitosis: Secondary | ICD-10-CM

## 2014-10-03 DIAGNOSIS — Z23 Encounter for immunization: Secondary | ICD-10-CM

## 2014-10-03 LAB — TSH: TSH: 1.262 u[IU]/mL (ref 0.350–4.500)

## 2014-10-03 MED ORDER — OMEPRAZOLE 40 MG PO CPDR: 40.0000 mg | DELAYED_RELEASE_CAPSULE | Freq: Every day | ORAL | Status: DC

## 2014-10-03 NOTE — Addendum Note (Signed)
Addended by: Clint Bolder D on: 10/03/2014 05:17 PM   Modules accepted: Kipp Brood

## 2014-10-03 NOTE — Progress Notes (Signed)
Subjective:   HPI  Connor Maldonado is a 24 y.o. male who presents for a complete physical.   Concerns: Recent issue with urinary retention has resolved.  Has concerns about bad breath, thinks its related to certain foods.   Reviewed their medical, surgical, family, social, medication, and allergy history and updated chart as appropriate.  Past Medical History  Diagnosis Date  . Wears contact lenses   . Allergy     Past Surgical History  Procedure Laterality Date  . Wisdom tooth extraction    . Tympanostomy tube placement      childhood    Social History   Social History  . Marital Status: Single    Spouse Name: N/A  . Number of Children: N/A  . Years of Education: N/A   Occupational History  . Not on file.   Social History Main Topics  . Smoking status: Never Smoker   . Smokeless tobacco: Not on file  . Alcohol Use: 1.2 oz/week    2 Shots of liquor per week  . Drug Use: No  . Sexual Activity: Yes     Comment: same partner since high school   Other Topics Concern  . 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 Kidney disease Mother     kidney trasplant  . Diabetes Mother   . Hypertension Father   . Heart disease Maternal Grandfather   . Cancer Maternal Grandfather     prostate  . Stroke Maternal Grandfather   . Heart disease Paternal Grandfather   . Diabetes Paternal Grandfather      Current outpatient prescriptions:  .  ibuprofen (ADVIL,MOTRIN) 600 MG tablet, Take 1 tablet (600 mg total) by mouth every 6 (six) hours as needed. (Patient not taking: Reported on 10/03/2014), Disp: 30 tablet, Rfl: 0 .  omeprazole (PRILOSEC) 40 MG capsule, Take 1 capsule (40 mg total) by mouth daily., Disp: 30 capsule, Rfl: 0  No Known  Allergies   Review of Systems Constitutional: -fever, -chills, -sweats, -unexpected weight change, -decreased appetite, -fatigue Allergy: -sneezing, -itching, -congestion Dermatology: -changing moles, --rash, -lumps ENT: -runny nose, -ear pain, -sore throat, -hoarseness, -sinus pain, -teeth pain, - ringing in ears, -hearing loss, -nosebleeds Cardiology: -chest pain, -palpitations, -swelling, -difficulty breathing when lying flat, -waking up short of breath Respiratory: -cough, -shortness of breath, -difficulty breathing with exercise or exertion, -wheezing, -coughing up blood Gastroenterology: -abdominal pain, -nausea, -vomiting, -diarrhea, -constipation, -blood in stool, -changes in bowel movement, -difficulty swallowing or eating Hematology: -bleeding, -bruising  Musculoskeletal: -joint aches, -muscle aches, -joint swelling, -back pain, -neck pain, -cramping, -changes in gait Ophthalmology: denies vision changes, eye redness, itching, discharge Urology: -burning with urination, -difficulty urinating, -blood in urine, -urinary frequency, -urgency, -incontinence Neurology: -headache, -weakness, -tingling, -numbness, -memory loss, -falls, -dizziness Psychology: -depressed mood, -agitation, -sleep problems     Objective:   Physical Exam  BP 110/74 mmHg  Pulse 75  Resp 18  Ht 6' (1.829 m)  Wt 157 lb (71.215 kg)  BMI 21.29 kg/m2  SpO2 97%  General appearance: alert, no distress, WD/WN, lean AA male Skin:scattered benign appearing macules, no worrisome lesions HEENT: normocephalic, conjunctiva/corneas normal, sclerae anicteric, PERRLA, EOMi, left TM with scar tissue from prior TM tube, nares patent, no  discharge, pharynx normal Oral cavity: MMM, tongue normal, teeth normal Neck: supple, no lymphadenopathy, no thyromegaly, no masses, normal ROM, no bruits Chest: non tender, normal shape and expansion Heart: RRR, normal S1, S2, no murmurs Lungs: CTA bilaterally, no wheezes, rhonchi, or  rales Abdomen: +bs, soft, non tender, non distended, no masses, no hepatomegaly, no splenomegaly, no bruits Back: non tender, normal ROM, no scoliosis Musculoskeletal: upper extremities non tender, no obvious deformity, normal ROM throughout, lower extremities non tender, no obvious deformity, normal ROM throughout Extremities: no edema, no cyanosis, no clubbing Pulses: 2+ symmetric, upper and lower extremities, normal cap refill Neurological: alert, oriented x 3, CN2-12 intact, strength normal upper extremities and lower extremities, sensation normal throughout, DTRs 2+ throughout, no cerebellar signs, gait normal Psychiatric: normal affect, behavior normal, pleasant  GU: normal male external genitalia, circumcised, nontender, no masses, no hernia, no lymphadenopathy Rectal: deferred   Assessment and Plan :     Encounter Diagnoses  Name Primary?  . Encounter for health maintenance examination in adult Yes  . Need for Tdap vaccination   . Need for influenza vaccination   . Bad breath   . Screen for STD (sexually transmitted disease)    Physical exam - discussed healthy lifestyle, diet, exercise, preventative care, vaccinations, and addressed their concerns.   Counseled on the Tdap (tetanus, diptheria, and acellular pertussis) vaccine.  Vaccine information sheet given. Tdap vaccine given after consent obtained. Counseled on the influenza virus vaccine.  Vaccine information sheet given.  Influenza vaccine given after consent obtained. See your dentist yearly for routine dental care including hygiene visits twice yearly. See your eye doctor yearly for routine vision care. Bad breath - could be related to GERD, discussed preventative measures, food diary, begin trial of Omeprazole daily.   He has tried OTC H2 blockers.    Follow-up pending labs

## 2014-10-04 LAB — HIV ANTIBODY (ROUTINE TESTING W REFLEX): HIV: NONREACTIVE

## 2014-10-04 LAB — RPR

## 2014-10-11 DIAGNOSIS — Z23 Encounter for immunization: Secondary | ICD-10-CM | POA: Diagnosis not present

## 2014-10-11 DIAGNOSIS — Z0000 Encounter for general adult medical examination without abnormal findings: Secondary | ICD-10-CM | POA: Diagnosis not present

## 2014-10-11 NOTE — Addendum Note (Signed)
Addended by: Herminio Commons A on: 10/11/2014 04:32 PM   Modules accepted: Orders

## 2014-11-25 ENCOUNTER — Encounter: Payer: Self-pay | Admitting: Medical

## 2014-11-25 ENCOUNTER — Ambulatory Visit (INDEPENDENT_AMBULATORY_CARE_PROVIDER_SITE_OTHER): Payer: 59 | Admitting: Medical

## 2014-11-25 VITALS — BP 114/78 | HR 56 | Temp 98.3°F

## 2014-11-25 DIAGNOSIS — H938X3 Other specified disorders of ear, bilateral: Secondary | ICD-10-CM

## 2014-11-25 DIAGNOSIS — H9193 Unspecified hearing loss, bilateral: Secondary | ICD-10-CM | POA: Diagnosis not present

## 2014-11-25 DIAGNOSIS — H9313 Tinnitus, bilateral: Secondary | ICD-10-CM | POA: Diagnosis not present

## 2014-11-25 MED ORDER — PSEUDOEPHEDRINE HCL ER 120 MG PO TB12: 120.0000 mg | ORAL_TABLET | Freq: Two times a day (BID) | ORAL | Status: DC

## 2014-11-25 NOTE — Patient Instructions (Signed)
Symptoms and exam suggest inner ear fluid pressure   Recommendation:  Drink plenty of water  Begin Sudafed as directed for the next 5-7 days  Use some OTC Ibuprofen for pain, pressure, and inflammation in the sinus  Avoid sudden movements   You can use OTC Emetrol for nausea if needed  You can use OTC Antivert for vertigo symptoms if needed  If not much improved by Monday, call us back  Vertigo Vertigo means you feel like you or your surroundings are moving when they are not. Vertigo can be dangerous if it occurs when you are at work, driving, or performing difficult activities.  CAUSES  Vertigo occurs when there is a conflict of signals sent to your brain from the visual and sensory systems in your body. There are many different causes of vertigo, including:  Infections, especially in the inner ear.  A bad reaction to a drug or misuse of alcohol and medicines.  Withdrawal from drugs or alcohol.  Rapidly changing positions, such as lying down or rolling over in bed.  A migraine headache.  Decreased blood flow to the brain.  Increased pressure in the brain from a head injury, infection, tumor, or bleeding. SYMPTOMS  You may feel as though the world is spinning around or you are falling to the ground. Because your balance is upset, vertigo can cause nausea and vomiting. You may have involuntary eye movements (nystagmus). DIAGNOSIS  Vertigo is usually diagnosed by physical exam. If the cause of your vertigo is unknown, your caregiver may perform imaging tests, such as an MRI scan (magnetic resonance imaging). TREATMENT  Most cases of vertigo resolve on their own, without treatment. Depending on the cause, your caregiver may prescribe certain medicines. If your vertigo is related to body position issues, your caregiver may recommend movements or procedures to correct the problem. In rare cases, if your vertigo is caused by certain inner ear problems, you may need surgery. HOME  CARE INSTRUCTIONS   Follow your caregiver's instructions.  Avoid driving.  Avoid operating heavy machinery.  Avoid performing any tasks that would be dangerous to you or others during a vertigo episode.  Tell your caregiver if you notice that certain medicines seem to be causing your vertigo. Some of the medicines used to treat vertigo episodes can actually make them worse in some people. SEEK IMMEDIATE MEDICAL CARE IF:   Your medicines do not relieve your vertigo or are making it worse.  You develop problems with talking, walking, weakness, or using your arms, hands, or legs.  You develop severe headaches.  Your nausea or vomiting continues or gets worse.  You develop visual changes.  A family member notices behavioral changes.  Your condition gets worse. MAKE SURE YOU:  Understand these instructions.  Will watch your condition.  Will get help right away if you are not doing well or get worse.   This information is not intended to replace advice given to you by your health care provider. Make sure you discuss any questions you have with your health care provider.   Document Released: 11/14/2004 Document Revised: 04/29/2011 Document Reviewed: 05/30/2014 Elsevier Interactive Patient Education 2016 [employer].    [employer] Barotitis media is inflammation of your middle ear. This occurs when the auditory tube (eustachian tube) leading from the back of your nose (nasopharynx) to your eardrum is blocked. This blockage may result from a cold, environmental allergies, or an upper respiratory infection. Unresolved barotitis media may lead to damage or hearing loss (  barotrauma), which may become permanent. HOME CARE INSTRUCTIONS   Use medicines as recommended by your health care provider. Over-the-counter medicines will help unblock the canal and can help during times of air travel.  Do not put anything into your ears to clean or unplug them. Eardrops will not be  helpful.  Do not swim, dive, or fly until your health care provider says it is all right to do so. If these activities are necessary, chewing gum with frequent, forceful swallowing may help. It is also helpful to hold your nose and gently blow to pop your ears for equalizing pressure changes. This forces air into the eustachian tube.  Only take over-the-counter or prescription medicines for pain, discomfort, or fever as directed by your health care provider.  A decongestant may be helpful in decongesting the middle ear and make pressure equalization easier. SEEK MEDICAL CARE IF:  You experience a serious form of dizziness in which you feel as if the room is spinning and you feel nauseated (vertigo).  Your symptoms only involve one ear. SEEK IMMEDIATE MEDICAL CARE IF:   You develop a severe headache, dizziness, or severe ear pain.  You have bloody or pus-like drainage from your ears.  You develop a fever.  Your problems do not improve or become worse. MAKE SURE YOU:   Understand these instructions.  Will watch your condition.  Will get help right away if you are not doing well or get worse.   This information is not intended to replace advice given to you by your health care provider. Make sure you discuss any questions you have with your health care provider.   Document Released: 02/02/2000 Document Revised: 11/25/2012 Document Reviewed: 09/01/2012 Elsevier Interactive Patient Education [employer]

## 2014-11-25 NOTE — Progress Notes (Signed)
   Chief Complaint  Patient presents with  . ear pain    both ears have been ringing. left ear he is not hearing well out of. started last week around monday or tuesday. no other concerns   Patient c/o 1+ week hx/o bilat ear pressure, occasional ringing in the ears, some dizziness at times, nausea.    Symptoms include ear congestion. Denies chills, dyspnea, ear pain, eye irritation, facial pain, fever, headache, myalgias, nasal congestion, productive cough, rhinorrhea, sneezing, sore throat and tooth pain. Using nothing for symptoms. denies sick contacts.  Past history is significant for ear TM tubes as a child.   Patient is not a smoker. No other aggravating or relieving factors.  No other c/o.  Past Medical History  Diagnosis Date  . Wears contact lenses   . Allergy    ROS as in subjective  Objective: BP 114/78 mmHg  Pulse 56  Temp(Src) 98.3 F (36.8 C)  General appearance: alert, no distress, WD/WN HEENT: normocephalic, sclerae anicteric, conjunctiva pink and moist, TMs flat, nares patent, no discharge or erythema, pharynx normal, tonsils unremarkable Oral cavity: MMM, no lesions Neck: supple, no lymphadenopathy, no thyromegaly, no masses Heart: RRR, normal S1, S2, no murmurs Lungs: CTA bilaterally, no wheezes, rhonchi, or rales Pulses: 2+ symmetric   Assessment: Encounter Diagnoses  Name Primary?  . Ear pressure, bilateral Yes  . Hearing trouble, bilateral   . Tinnitus of both ears     Plan: Hearing exam normal.  discussed symptoms suggestive of eustachian tube dysfunction.     Recommendation:  Drink plenty of water  Begin Sudafed as directed for the next 5-7 days  Use some OTC Ibuprofen for pain, pressure, and inflammation in the sinus  Avoid sudden movements   You can use OTC Emetrol for nausea if needed  You can use OTC Antivert for vertigo symptoms if needed  If not much improved by Monday, call us back
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
## 2020-05-12 ENCOUNTER — Ambulatory Visit (HOSPITAL_COMMUNITY)
Admission: EM | Admit: 2020-05-12 | Discharge: 2020-05-12 | Disposition: A | Discharge status: Other Acute Inpatient Hospital | Payer: No Payment, Other | Attending: Psychiatry | Admitting: Psychiatry

## 2020-05-12 ENCOUNTER — Inpatient Hospital Stay (HOSPITAL_COMMUNITY)
Admission: AD | Admit: 2020-05-12 | Payer: Federal, State, Local not specified - Other | Source: Intra-hospital | Admitting: Psychiatry

## 2020-05-12 DIAGNOSIS — Z818 Family history of other mental and behavioral disorders: Secondary | ICD-10-CM | POA: Insufficient documentation

## 2020-05-12 DIAGNOSIS — Z20822 Contact with and (suspected) exposure to covid-19: Secondary | ICD-10-CM | POA: Insufficient documentation

## 2020-05-12 DIAGNOSIS — F29 Unspecified psychosis not due to a substance or known physiological condition: Secondary | ICD-10-CM | POA: Insufficient documentation

## 2020-05-12 DIAGNOSIS — R45851 Suicidal ideations: Secondary | ICD-10-CM | POA: Insufficient documentation

## 2020-05-12 DIAGNOSIS — Z79899 Other long term (current) drug therapy: Secondary | ICD-10-CM | POA: Insufficient documentation

## 2020-05-12 LAB — CBC WITH DIFFERENTIAL/PLATELET
Abs Immature Granulocytes: 0.01 10*3/uL (ref 0.00–0.07)
Basophils Absolute: 0.1 10*3/uL (ref 0.0–0.1)
Basophils Relative: 1 %
Eosinophils Absolute: 0.1 10*3/uL (ref 0.0–0.5)
Eosinophils Relative: 1 %
HCT: 48 % (ref 39.0–52.0)
Hemoglobin: 17.2 g/dL — ABNORMAL HIGH (ref 13.0–17.0)
Immature Granulocytes: 0 %
Lymphocytes Relative: 39 %
Lymphs Abs: 2.3 10*3/uL (ref 0.7–4.0)
MCH: 32 pg (ref 26.0–34.0)
MCHC: 35.8 g/dL (ref 30.0–36.0)
MCV: 89.2 fL (ref 80.0–100.0)
Monocytes Absolute: 0.5 10*3/uL (ref 0.1–1.0)
Monocytes Relative: 8 %
Neutro Abs: 2.9 10*3/uL (ref 1.7–7.7)
Neutrophils Relative %: 51 %
Platelets: 265 10*3/uL (ref 150–400)
RBC: 5.38 MIL/uL (ref 4.22–5.81)
RDW: 11.9 % (ref 11.5–15.5)
WBC: 5.8 10*3/uL (ref 4.0–10.5)
nRBC: 0 % (ref 0.0–0.2)

## 2020-05-12 LAB — COMPREHENSIVE METABOLIC PANEL
ALT: 20 U/L (ref 0–44)
AST: 23 U/L (ref 15–41)
Albumin: 5.4 g/dL — ABNORMAL HIGH (ref 3.5–5.0)
Alkaline Phosphatase: 54 U/L (ref 38–126)
Anion gap: 13 (ref 5–15)
BUN: 15 mg/dL (ref 6–20)
CO2: 25 mmol/L (ref 22–32)
Calcium: 10.4 mg/dL — ABNORMAL HIGH (ref 8.9–10.3)
Chloride: 98 mmol/L (ref 98–111)
Creatinine, Ser: 1.35 mg/dL — ABNORMAL HIGH (ref 0.61–1.24)
GFR, Estimated: >60 mL/min (ref >60)
Glucose, Bld: 111 mg/dL — ABNORMAL HIGH (ref 70–99)
Potassium: 4.3 mmol/L (ref 3.5–5.1)
Sodium: 136 mmol/L (ref 135–145)
Total Bilirubin: 1.4 mg/dL — ABNORMAL HIGH (ref 0.3–1.2)
Total Protein: 8.4 g/dL — ABNORMAL HIGH (ref 6.5–8.1)

## 2020-05-12 LAB — POCT URINE DRUG SCREEN - MANUAL ENTRY (I-SCREEN)
POC Amphetamine UR: NOT DETECTED
POC Buprenorphine (BUP): NOT DETECTED
POC Cocaine UR: NOT DETECTED
POC Marijuana UR: NOT DETECTED
POC Methadone UR: NOT DETECTED
POC Methamphetamine UR: NOT DETECTED
POC Morphine: NOT DETECTED
POC Oxazepam (BZO): NOT DETECTED
POC Oxycodone UR: NOT DETECTED
POC Secobarbital (BAR): NOT DETECTED

## 2020-05-12 LAB — LIPID PANEL
Cholesterol: 207 mg/dL — ABNORMAL HIGH (ref 0–200)
HDL: 62 mg/dL (ref >40)
LDL Cholesterol: 135 mg/dL — ABNORMAL HIGH (ref 0–99)
Total CHOL/HDL Ratio: 3.3 RATIO
Triglycerides: 50 mg/dL (ref <150)
VLDL: 10 mg/dL (ref 0–40)

## 2020-05-12 LAB — TSH: TSH: 2.589 u[IU]/mL (ref 0.350–4.500)

## 2020-05-12 LAB — ETHANOL: Alcohol, Ethyl (B): <10 mg/dL (ref <10)

## 2020-05-12 LAB — HEMOGLOBIN A1C
Hgb A1c MFr Bld: 4.5 % — ABNORMAL LOW (ref 4.8–5.6)
Mean Plasma Glucose: 82.45 mg/dL

## 2020-05-12 LAB — RESP PANEL BY RT-PCR (FLU A&B, COVID) ARPGX2
Influenza A by PCR: NEGATIVE
Influenza B by PCR: NEGATIVE
SARS Coronavirus 2 by RT PCR: NEGATIVE

## 2020-05-12 MED ORDER — TRAZODONE HCL 50 MG PO TABS: 50.0000 mg | ORAL_TABLET | Freq: Every evening | ORAL | Status: DC | PRN

## 2020-05-12 MED ORDER — MAGNESIUM HYDROXIDE 400 MG/5ML PO SUSP: 30.0000 mL | Freq: Every day | ORAL | Status: DC | PRN

## 2020-05-12 MED ORDER — HYDROXYZINE HCL 25 MG PO TABS
25.0000 mg | ORAL_TABLET | Freq: Three times a day (TID) | ORAL | Status: DC | PRN
  Administered 2020-05-12: 25 mg via ORAL
  Filled 2020-05-12: qty 1

## 2020-05-12 MED ORDER — RISPERIDONE 1 MG PO TBDP: 1.0000 mg | ORAL_TABLET | Freq: Two times a day (BID) | ORAL | Status: DC

## 2020-05-12 MED ORDER — ACETAMINOPHEN 325 MG PO TABS: 650.0000 mg | ORAL_TABLET | Freq: Four times a day (QID) | ORAL | Status: DC | PRN

## 2020-05-12 MED ORDER — RISPERIDONE 1 MG PO TBDP
1.0000 mg | ORAL_TABLET | Freq: Two times a day (BID) | ORAL | Status: DC
  Filled 2020-05-12: qty 1

## 2020-05-12 MED ORDER — ALUM & MAG HYDROXIDE-SIMETH 200-200-20 MG/5ML PO SUSP: 30.0000 mL | ORAL | Status: DC | PRN

## 2020-05-12 MED ORDER — OLANZAPINE 10 MG PO TBDP: 10.0000 mg | ORAL_TABLET | Freq: Three times a day (TID) | ORAL | Status: DC | PRN

## 2020-05-12 MED ORDER — OLANZAPINE 10 MG IM SOLR: 10.0000 mg | Freq: Three times a day (TID) | INTRAMUSCULAR | Status: DC | PRN

## 2020-05-12 NOTE — Progress Notes (Signed)
Per [facility] Director, due to severe staffing issues, patient has been referred out at this time.   Pt. meets criteria for inpatient treatment per Earlene Plater, MD . Referred out to the following hospitals:   Destination  Service Provider Address Phone Fax  [facility]  [address], [place] [address] [phone]  [facility]  [address] [place] [address] [phone]  [facility]  [address] [place] [address] [phone]  [facility]  [address] [place], [place] [address] [phone]  [facility]  [address] [place], [place] [address] [phone]  [facility]  [address] [place] [address] [phone]  [facility]  [address], [place] [address] [phone]  [facility]  [address] [place] [address] [phone]  [facility]  [address] [place] [address] [phone]  [facility]  [address] [place] [address] [phone]  [facility]  [address], [place] [address] [phone]  [facility]  [address] [place] [address] [phone]     Disposition CSW will continue to follow for placement.  Signed:  Corky Crafts, MSW, [place], LCASA 05/12/2020 1:36 PM

## 2020-05-12 NOTE — Progress Notes (Signed)
Pt accepted to [facility]   Patient meets inpatient criteria per Earlene Plater, MD   Dr. Angela Burke is the attending provider.    Call report to [phone]  Sharion Dove, RN @ [facility] notified.     Pt scheduled  to arrive at [facility] after sending report.   Signed:  Corky Crafts, MSW, [place], LCASA 05/12/2020 2:34 PM

## 2020-05-12 NOTE — ED Notes (Signed)
Pt provided information/education on scheduled and PRN medications per pt's request. Pt stated that he did not want to take the risperidone but was willing to take vistaril. Pt informed RN that, "I didn't start having problems until my mother passed. It was just a lot to deal with." Pt reports, "She had kidney failure, and I wanted to prepare a room for her to stay with me so I could take care of her and give her one of my kidneys, but she died before that could happen."Pt also states, "I know she's dead now but making a peaceful and safe place for her is something I still wanted to do. Pt denies SI/HI, A/VH, and he is pleasant and cooperative on the unit. Education, support and encouragement provided. Pt denies any concerns at this time, and verbally contracts for safety. Pt ambulating on the unit with no issues. Pt remains safe on the unit.

## 2020-05-12 NOTE — ED Provider Notes (Addendum)
Behavioral Health Progress Note  Date and Time: 05/12/2020 9:48 AM Name: Connor Maldonado MRN:  [id number]  Subjective:   Patient interviewed this AM. He is calm, cooperative and pleasant. TC is delusional and paranoid in nature. He discusses the reason for hospital presentation earlier this morning. He states that his apartment is bugged and he believes that people are listening to him and watching him through cameras; he is unsure who is bugging his apartment but suspects it is his landlord. He states that he often spends most of his time in his apartment but that when he does leave he notices that he is being followed by the same people; he does not know who they are but raises suspicion that it could be people that his family members hired because "they are against me". He is unable/unwilling to provide additional details.  He states that this has been going on since his mother passed away 2 years ago but that it has "gotten worse" since then. He states that his mother passed away partly due to complications related to renal failure and states that he wakes up at night and feels someone operating on his kidney. He states that his ability to urinate has improved since this surgery and feels that it has been helpful. He reports that he has no scars or marks from the surgery because there is "new age technology" and makes it difficult to tell that he even had surgery but was able to "feel the bruise on the inside:. 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 He endorses thought insertion. He frequently mentions "spirits" throughout interview and indicates that his deceased mother has been visiting him. Reports a family hx of schizophrenia in an aunt.  He denies SI/HI at this time. He denies AVH. Discussed recommendation of inpatient  admission; pt is amenable.   Diagnosis:  Final diagnoses:  Psychosis, unspecified psychosis type (HCC)    Total Time spent with patient: 30 minutes  Past Psychiatric History: see H&P Past Medical History:  Past Medical History:  Diagnosis Date  . Allergy   . Wears contact lenses     Past Surgical History:  Procedure Laterality Date  . TYMPANOSTOMY TUBE PLACEMENT     childhood  . WISDOM TOOTH EXTRACTION     Family History:  Family History  Problem Relation Age of Onset  . Kidney disease Mother        kidney trasplant  . Diabetes Mother   . Hypertension Father   . Heart disease Maternal Grandfather   . Cancer Maternal Grandfather        prostate  . Stroke Maternal Grandfather   . Heart disease Paternal Grandfather   . Diabetes Paternal Grandfather    Family Psychiatric  History: see H&P Social History:  Social History   Substance and Sexual Activity  Alcohol Use Yes  . Alcohol/week: 2.0 standard drinks  . Types: 2 Shots of liquor per week     Social History   Substance and Sexual Activity  Drug Use No    Social History   Socioeconomic History  . Marital status: Single    Spouse name: Not on file  . Number of children: Not on file  . Years of education: Not on file  . Highest education level: Not on  file  Occupational History  . Not on file  Tobacco Use  . Smoking status: Never Smoker  . Smokeless tobacco: Not on file  Substance and Sexual Activity  . Alcohol use: Yes    Alcohol/week: 2.0 standard drinks    Types: 2 Shots of liquor per week  . Drug use: No  . Sexual activity: Yes    Comment: same partner since high school  Other Topics Concern  . 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 Smoking Tobacco Use: Never Smoker  . Smokeless Tobacco Use: Unknown  Transportation Needs: Not on file   Additional Social History:    Pain Medications: See MAR Prescriptions: See MAR Over the Counter: See MAR History of alcohol / drug use?: No history of alcohol / drug abuse (Pt denies.)                    Sleep: Fair  Appetite:  Fair  Current Medications:  Current Facility-Administered Medications  Medication Dose Route Frequency Provider Last Rate Last Admin  . acetaminophen (TYLENOL) tablet 650 mg  650 mg Oral Q6H PRN Jackelyn Poling, NP      . alum & mag hydroxide-simeth (MAALOX/MYLANTA) 200-200-20 MG/5ML suspension 30 mL  30 mL Oral Q4H PRN [name] A, NP      . hydrOXYzine (ATARAX/VISTARIL) tablet 25 mg  25 mg Oral TID PRN [name] A, NP      . magnesium hydroxide (MILK OF MAGNESIA) suspension 30 mL  30 mL Oral Daily PRN [name] A, NP      . OLANZapine (ZYPREXA) injection 10 mg  10 mg Intramuscular Q8H PRN Jackelyn Poling, NP       Or  . OLANZapine zydis (ZYPREXA) disintegrating tablet 10 mg  10 mg Oral Q8H PRN [name] A, NP      . traZODone (DESYREL) tablet 50 mg  50 mg Oral QHS PRN Jackelyn Poling, NP       No current outpatient medications on file.    Labs  Lab Results:  Admission on 05/12/2020  Component Date Value Ref Range Status  . SARS Coronavirus 2 by RT PCR 05/12/2020 NEGATIVE  NEGATIVE Final   Comment: (NOTE) SARS-CoV-2 target nucleic acids are NOT DETECTED.  The SARS-CoV-2 RNA is generally detectable  in upper respiratory specimens during the acute phase of infection. The lowest concentration of SARS-CoV-2 viral copies this assay can detect is 138 copies/mL. A negative result does not preclude SARS-Cov-2 infection and should not be used as the sole basis for treatment or other patient management decisions. A negative result may occur with  improper specimen collection/handling, submission of specimen other than nasopharyngeal swab, presence of viral  mutation(s) within the areas targeted by this assay, and inadequate number of viral copies(<138 copies/mL). A negative result must be combined with clinical observations, patient history, and epidemiological information. The expected result is Negative.  Fact Sheet for Patients:  BloggerCourse.com  Fact Sheet for Healthcare Providers:  SeriousBroker.it  This test is no                          t yet approved or cleared by the [place] FDA and  has been authorized for detection and/or diagnosis of SARS-CoV-2 by FDA under an Emergency Use Authorization (EUA). This EUA will remain  in effect (meaning this test can be used) for the duration of the COVID-19 declaration under Section 564(b)(1) of the Act, 21 U.S.C.section 360bbb-3(b)(1), unless the authorization is terminated  or revoked sooner.      . Influenza A by PCR 05/12/2020 NEGATIVE  NEGATIVE Final  . Influenza B by PCR 05/12/2020 NEGATIVE  NEGATIVE Final   Comment: (NOTE) The Xpert Xpress SARS-CoV-2/FLU/RSV plus assay is intended as an aid in the diagnosis of influenza from Nasopharyngeal swab specimens and should not be used as a sole basis for treatment. Nasal washings and aspirates are unacceptable for Xpert Xpress SARS-CoV-2/FLU/RSV testing.  Fact Sheet for Patients: BloggerCourse.com  Fact Sheet for Healthcare Providers: SeriousBroker.it  This test is not yet  approved or cleared by the [place] FDA and has been authorized for detection and/or diagnosis of SARS-CoV-2 by FDA under an Emergency Use Authorization (EUA). This EUA will remain in effect (meaning this test can be used) for the duration of the COVID-19 declaration under Section 564(b)(1) of the Act, 21 U.S.C. section 360bbb-3(b)(1), unless the authorization is terminated or revoked.  Performed at [facility], [address], [place], [place] [address]   . WBC 05/12/2020 5.8  4.0 - 10.5 K/uL Final  . RBC 05/12/2020 5.38  4.22 - 5.81 MIL/uL Final  . Hemoglobin 05/12/2020 17.2* 13.0 - 17.0 g/dL Final  . HCT 55/73/2202 48.0  39.0 - 52.0 % Final  . MCV 05/12/2020 89.2  80.0 - 100.0 fL Final  . MCH 05/12/2020 32.0  26.0 - 34.0 pg Final  . MCHC 05/12/2020 35.8  30.0 - 36.0 g/dL Final  . RDW 54/27/0623 11.9  11.5 - 15.5 % Final  . Platelets 05/12/2020 265  150 - 400 K/uL Final  . nRBC 05/12/2020 0.0  0.0 - 0.2 % Final  . Neutrophils Relative % 05/12/2020 51  % Final  . Neutro Abs 05/12/2020 2.9  1.7 - 7.7 K/uL Final  . Lymphocytes Relative 05/12/2020 39  % Final  . Lymphs Abs 05/12/2020 2.3  0.7 - 4.0 K/uL Final  . Monocytes Relative 05/12/2020 8  % Final  . Monocytes Absolute 05/12/2020 0.5  0.1 - 1.0 K/uL Final  . Eosinophils Relative 05/12/2020 1  % Final  . Eosinophils Absolute 05/12/2020 0.1  0.0 - 0.5 K/uL Final  . Basophils Relative 05/12/2020 1  % Final  . Basophils Absolute 05/12/2020 0.1  0.0 - 0.1 K/uL Final  . Immature Granulocytes 05/12/2020 0  % Final  . Abs Immature Granulocytes 05/12/2020 0.01  0.00 - 0.07 K/uL Final   Performed at [facility], [address], [place], [place] [address]  . Sodium 05/12/2020 136  135 - 145 mmol/L Final  . Potassium 05/12/2020 4.3  3.5 - 5.1 mmol/L Final  . Chloride 05/12/2020 98  98 - 111 mmol/L Final  .  CO2 05/12/2020 25  22 - 32 mmol/L Final  . Glucose, Bld 05/12/2020 111* 70 - 99 mg/dL Final   Glucose reference  range applies only to samples taken after fasting for at least 8 hours.  . BUN 05/12/2020 15  6 - 20 mg/dL Final  . Creatinine, Ser 05/12/2020 1.35* 0.61 - 1.24 mg/dL Final  . Calcium 40/98/119103/25/2022 10.4* 8.9 - 10.3 mg/dL Final  . Total Protein 05/12/2020 8.4* 6.5 - 8.1 g/dL Final  . Albumin 47/82/956203/25/2022 5.4* 3.5 - 5.0 g/dL Final  . AST 13/08/657803/25/2022 23  15 - 41 U/L Final  . ALT 05/12/2020 20  0 - 44 U/L Final  . Alkaline Phosphatase 05/12/2020 54  38 - 126 U/L Final  . Total Bilirubin 05/12/2020 1.4* 0.3 - 1.2 mg/dL Final  . GFR, Estimated 05/12/2020 >60  >60 mL/min Final   Comment: (NOTE) Calculated using the CKD-EPI Creatinine Equation (2021)   . Anion gap 05/12/2020 13  5 - 15 Final   Performed at [facility], [address] [place], [place] [address]  . Hgb A1c MFr Bld 05/12/2020 4.5* 4.8 - 5.6 % Final   Comment: (NOTE) Pre diabetes:          5.7%-6.4%  Diabetes:              >6.4%  Glycemic control for   <7.0% adults with diabetes   . Mean Plasma Glucose 05/12/2020 82.45  mg/dL Final   Performed at [facility], [address] [place], [place] [address]  . Alcohol, Ethyl (B) 05/12/2020 <10  <10 mg/dL Final   Comment: (NOTE) Lowest detectable limit for serum alcohol is 10 mg/dL.  For medical purposes only. Performed at [facility], [address] [place], [place] [address]   . TSH 05/12/2020 2.589  0.350 - 4.500 uIU/mL Final   Comment: Performed by a 3rd Generation assay with a functional sensitivity of <=0.01 uIU/mL. Performed at [facility], [address] [place], [place] [address]   . POC Amphetamine UR 05/12/2020 None Detected  NONE DETECTED (Cut Off Level 1000 ng/mL) Final  . POC Secobarbital (BAR) 05/12/2020 None Detected  NONE DETECTED (Cut Off Level 300 ng/mL) Final  . POC Buprenorphine (BUP) 05/12/2020 None Detected  NONE DETECTED (Cut Off Level 10 ng/mL) Final  . POC Oxazepam (BZO) 05/12/2020 None Detected  NONE DETECTED (Cut Off Level  300 ng/mL) Final  . POC Cocaine UR 05/12/2020 None Detected  NONE DETECTED (Cut Off Level 300 ng/mL) Final  . POC Methamphetamine UR 05/12/2020 None Detected  NONE DETECTED (Cut Off Level 1000 ng/mL) Final  . POC Morphine 05/12/2020 None Detected  NONE DETECTED (Cut Off Level 300 ng/mL) Final  . POC Oxycodone UR 05/12/2020 None Detected  NONE DETECTED (Cut Off Level 100 ng/mL) Final  . POC Methadone UR 05/12/2020 None Detected  NONE DETECTED (Cut Off Level 300 ng/mL) Final  . POC Marijuana UR 05/12/2020 None Detected  NONE DETECTED (Cut Off Level 50 ng/mL) Final  . Cholesterol 05/12/2020 207* 0 - 200 mg/dL Final  . Triglycerides 05/12/2020 50  <150 mg/dL Final  . HDL 25/36/644003/25/2022 62  >40 mg/dL Final  . Total CHOL/HDL Ratio 05/12/2020 3.3  RATIO Final  . VLDL 05/12/2020 10  0 - 40 mg/dL Final  . LDL Cholesterol 05/12/2020 135* 0 - 99 mg/dL Final   Comment:        Total Cholesterol/HDL:CHD Risk Coronary Heart Disease Risk Table  Men   Women  1/2 Average Risk   3.4   3.3  Average Risk       5.0   4.4  2 X Average Risk   9.6   7.1  3 X Average Risk  23.4   11.0        Use the calculated Patient Ratio above and the CHD Risk Table to determine the patient's CHD Risk.        ATP III CLASSIFICATION (LDL):  <100     mg/dL   Optimal  161-096  mg/dL   Near or Above                    Optimal  130-159  mg/dL   Borderline  045-409  mg/dL   High  >811     mg/dL   Very High Performed at [facility], [address], [place], [place] [address]     Blood Alcohol level:  Lab Results  Component Value Date   ETH <10 05/12/2020    Metabolic Disorder Labs: Lab Results  Component Value Date   HGBA1C 4.5 (L) 05/12/2020   MPG 82.45 05/12/2020   No results found for: PROLACTIN Lab Results  Component Value Date   CHOL 207 (H) 05/12/2020   TRIG 50 05/12/2020   HDL 62 05/12/2020   CHOLHDL 3.3 05/12/2020   VLDL 10 05/12/2020   LDLCALC 135 (H) 05/12/2020   LDLCALC 66  05/25/2013    Therapeutic Lab Levels: No results found for: LITHIUM No results found for: VALPROATE No components found for:  CBMZ  Physical Findings   Flowsheet Row ED from 05/12/2020 in [facility]  C-SSRS RISK CATEGORY High Risk       Musculoskeletal  Strength & Muscle Tone: within normal limits Gait & Station: normal Patient leans: N/A  Psychiatric Specialty Exam  Presentation  General Appearance: Appropriate for Environment; Fairly Groomed  Eye Contact:Fair  Speech:Clear and Coherent  Speech Volume:Decreased  Handedness:No data recorded  Mood and Affect  Mood:Depressed; Dysphoric; Anxious  Affect:Flat   Thought Process  Thought Processes:Disorganized  Descriptions of Associations:Loose  Orientation:Full (Time, Place and Person)  Thought Content:Delusions; Paranoid Ideation  Diagnosis of Schizophrenia or Schizoaffective disorder in past: No  Duration of Psychotic Symptoms: Less than six months   Hallucinations:Hallucinations: Tactile (feels people doing surgery on him)  Ideas of Reference:Delusions; Paranoia  Suicidal Thoughts:Suicidal Thoughts: Yes, Passive SI Active Intent and/or Plan: With Intent; With Plan; With Means to Carry Out  Homicidal Thoughts:Homicidal Thoughts: No   Sensorium  Memory:Immediate Fair; Recent Fair  Judgment:Impaired  Insight:Lacking   Executive Functions  Concentration:Fair  Attention Span:Fair  Recall:Fair  Fund of Knowledge:Fair  Language:Good   Psychomotor Activity  Psychomotor Activity:Psychomotor Activity: Normal   Assets  Assets:Communication Skills; Desire for Improvement; Social Support; Vocational/Educational   Sleep  Sleep:Sleep: Fair   Nutritional Assessment (For OBS and FBC admissions only) Has the patient had a weight loss or gain of 10 pounds or more in the last 3 months?: No Has the patient had a decrease in food intake/or appetite?: No Does the  patient have dental problems?: No Does the patient have eating habits or behaviors that may be indicators of an eating disorder including binging or inducing vomiting?: No Has the patient recently lost weight without trying?: No Has the patient been eating poorly because of a decreased appetite?: No Malnutrition Screening Tool Score: 0    Physical Exam  Physical Exam Constitutional:  Appearance: Normal appearance. He is normal weight.  HENT:     Head: Normocephalic and atraumatic.  Pulmonary:     Effort: Pulmonary effort is normal.  Neurological:     Mental Status: He is alert.    Review of Systems  Constitutional: Negative for chills and fever.  Eyes: Negative for discharge.  Respiratory: Negative for cough.   Cardiovascular: Negative for chest pain.  Gastrointestinal: Negative for abdominal pain.  Musculoskeletal: Positive for back pain.       Back pain related to reported kidney surgery   Neurological: Negative for headaches.  Psychiatric/Behavioral: Negative for hallucinations, substance abuse and suicidal ideas. The patient is nervous/anxious.    Blood pressure (!) 143/85, pulse 99, temperature 98.2 F (36.8 C), temperature source Oral, resp. rate 16, SpO2 100 %. There is no height or weight on file to calculate BMI.  Treatment Plan Summary: Recommend inpatient admission at this time as patient is acutely psychotic. [facility] has no bed availability at this time. SW aware and patient will be faxed out.  Patient not on any scheduled antipsychotics at this time. Will initiate risperdal 1 mg BID for psychotic symptoms  Patient is currently voluntary but would be IVC'able if he attempts to leave as patient is currently acutely psychotic.  Daily contact with patient to assess and evaluate symptoms and progress in treatment while he remains in the [facility] awaiting inpatient treatment.  Estella Husk, MD 05/12/2020 9:48 AM

## 2020-05-12 NOTE — ED Notes (Signed)
Pt offered breakfast;declined. 

## 2020-05-12 NOTE — ED Notes (Addendum)
Pt asleep in bed. Respirations even and unlabored. Will continue to monitor for safety. ?
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
## 2020-05-12 NOTE — ED Notes (Signed)
Pt alert and oriented on the unit. Education, support, and encouragement provided. Discharge summary, medications and follow up appointments reviewed with pt. Suicide prevention resources provided. Pt's belongings in locker # 27 returned and belongings sheet signed. Pt denies SI/HI, A/VH, pain, or any concerns at this time. Pt ambulatory on and off unit. Pt discharged to lobby to be [facility] via [facility].
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
## 2020-05-12 NOTE — ED Notes (Addendum)
Pt admitted to continuous assessment due to SI and psychosis. Pt A&O x4, calm and cooperative. Pt tolerated lab work and skin assessment well. Pt ambulated independently to unit. Oriented to unit/staff. No signs of acute distress noted. Will continue to monitor for safety.

## 2020-05-12 NOTE — ED Provider Notes (Signed)
FBC/OBS ASAP Discharge Summary  Date and Time: 05/12/2020 2:40 PM  Name: Connor Maldonado  MRN:  [id number]   Discharge Diagnoses:  Final diagnoses:  Psychosis, unspecified psychosis type [facility])    Subjective:  Patient interviewed this AM. He is calm, cooperative and pleasant. TC is delusional and paranoid in nature. He discusses the reason for hospital presentation earlier this morning. He states that his apartment is bugged and he believes that people are listening to him and watching him through cameras; he is unsure who is bugging his apartment but suspects it is his landlord. He states that he often spends most of his time in his apartment but that when he does leave he notices that he is being followed by the same people; he does not know who they are but raises suspicion that it could be people that his family members hired because "they are against me". He is unable/unwilling to provide additional details.  He states that this has been going on since his mother passed away 2 years ago but that it has "gotten worse" since then. He states that his mother passed away partly due to complications related to renal failure and states that he wakes up at night and feels someone operating on his kidney. He states that his ability to urinate has improved since this surgery and feels that it has been helpful. He reports that he has no scars or marks from the surgery because there is "new age technology" and makes it difficult to tell that he even had surgery but was able to "feel the bruise on the inside:. 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 He endorses thought insertion. He frequently mentions "spirits" throughout interview and indicates that his deceased mother has been visiting him. Reports a family hx of schizophrenia in an  aunt.  He denies SI/HI at this time. He denies AVH. Discussed recommendation of inpatient admission; pt is amenable.    Stay Summary:  Connor Maldonado is a 30 y.o. male who presents to [facility] voluntarily to [facility] with his family due to SI and paranoia on 05/12/20. On evaluation, patient is alert and oriented x 4. He is calm and cooperative. Affect is flat. Speech is decreased in volume and mumbled at times. Patient states "I don't really feel depressed, I just want to end it all." When a asked for clarification, he states that he wants to kill himself. He states that he asked his brother for a gun. Patient states that he feels that he has a chip implanted in his head that someone is using the chip to influence him to kill himself. He states that is not sure how the chip was implanted but thinks someone came into his house during the night and performed surgery to implant a chip. Patient states that he feels that there are cameras in his apartment and someone is monitoring him. Patient states that at night he feels that paramedics are coming into his house and sticking him with needles and performing surgery on his abdomen. States that he can feel them cutting and sticking him with needles. However, patient states he has vivid dreams, so he thinks he could be dreaming. Patient provides an unclear timeline. He states that he feels like cameras have been in his apartment for more  than a year, but states "it never bothered me until I was living alone." States that he has been living alone since November, and since then has had time to think about cameras in his apartment. He states that various family members have stayed with him over the past year, until November.   Patient reports that he drinks alcohol socially. States "I stopped drinking heavily two months ago." States that he used to have 4-5 drinks daily. He states that he had a couple of drinks with his brother this evening. On chart review, his alcohol use  is mentioned in notes during primary care visits, but it does not appear that he has received treatment or been diagnosed with alcohol use disorder. He denies use of marijuana and other substances. UDS negative.  He reports that his mother passed away unexpectedly 2 years ago. States that he has been feeling "different" since then. She states that he participated in therapy after her death, but has received no other psychiatric care.    Patient was admitted for overnight observation and started on risperdal 1 mg BID for psychotic sx. See above for day of transfer interview-pt remained psychotic-delusional, disorganized. Patient was accepted by [facility]  Total Time spent with patient: 30 minutes  Past Psychiatric History: none Past Medical History:  Past Medical History:  Diagnosis Date  . Allergy   . Wears contact lenses     Past Surgical History:  Procedure Laterality Date  . TYMPANOSTOMY TUBE PLACEMENT     childhood  . WISDOM TOOTH EXTRACTION     Family History:  Family History  Problem Relation Age of Onset  . Kidney disease Mother        kidney trasplant  . Diabetes Mother   . Hypertension Father   . Heart disease Maternal Grandfather   . Cancer Maternal Grandfather        prostate  . Stroke Maternal Grandfather   . Heart disease Paternal Grandfather   . Diabetes Paternal Grandfather    Family Psychiatric History: aunt with schizophrenia Social History:  Social History   Substance and Sexual Activity  Alcohol Use Yes  . Alcohol/week: 2.0 standard drinks  . Types: 2 Shots of liquor per week     Social History   Substance and Sexual Activity  Drug Use No    Social History   Socioeconomic History  . Marital status: Single    Spouse name: Not on file  . Number of children: Not on file  . Years of education: Not on file  . Highest education level: Not on file  Occupational History  . Not on file  Tobacco Use  . Smoking status: Never Smoker  .  Smokeless tobacco: Not on file  Substance and Sexual Activity  . Alcohol use: Yes    Alcohol/week: 2.0 standard drinks    Types: 2 Shots of liquor per week  . Drug use: No  . Sexual activity: Yes    Comment: same partner since high school  Other Topics Concern  . 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 Smoking Tobacco Use: Never Smoker  . Smokeless Tobacco Use: Unknown  Transportation Needs: Not on file    Has this patient used any form of tobacco in the last 30 days? (Cigarettes, Smokeless Tobacco, Cigars, and/or Pipes) Prescription not provided because: transfer [facility]  Current Medications:  Current Facility-Administered Medications  Medication Dose Route Frequency Provider Last Rate Last Admin  . acetaminophen (TYLENOL) tablet 650 mg  650 mg Oral Q6H PRN [name] A, NP      . alum & mag hydroxide-simeth (MAALOX/MYLANTA) 200-200-20 MG/5ML suspension 30 mL  30 mL Oral Q4H PRN [name] A, NP      . hydrOXYzine (ATARAX/VISTARIL) tablet 25 mg  25 mg Oral TID PRN [name] A, NP   25 mg at 05/12/20 1150  . magnesium hydroxide (MILK OF MAGNESIA) suspension 30 mL  30 mL Oral Daily PRN [name] A, NP      . OLANZapine (ZYPREXA) injection 10 mg  10 mg Intramuscular Q8H  PRN Jackelyn Poling, NP       Or  . OLANZapine zydis (ZYPREXA) disintegrating tablet 10 mg  10 mg Oral Q8H PRN [name] A, NP      . risperiDONE (RISPERDAL M-TABS) disintegrating tablet 1 mg  1 mg Oral BID Estella Husk, MD      . traZODone (DESYREL) tablet 50 mg  50 mg Oral QHS PRN Jackelyn Poling, NP       No current outpatient medications on file.    PTA Medications: (Not in a hospital admission)   Musculoskeletal  Strength & Muscle Tone: within normal limits Gait & Station: normal Patient leans: N/A  Psychiatric Specialty Exam  Presentation  General Appearance: Appropriate for Environment; Fairly Groomed  Eye Contact:Fair  Speech:Clear and Coherent  Speech Volume:Decreased  Handedness:No data recorded  Mood and Affect  Mood:Depressed; Dysphoric; Anxious  Affect:Flat   Thought Process  Thought Processes:Disorganized  Descriptions of Associations:Loose  Orientation:Full (Time, Place and Person)  Thought Content:Delusions; Paranoid Ideation  Diagnosis of Schizophrenia or Schizoaffective disorder in past: No  Duration of Psychotic Symptoms: Less than six months   Hallucinations:Hallucinations: Tactile (feels people doing surgery on him)  Ideas of Reference:Delusions; Paranoia  Suicidal Thoughts:Suicidal Thoughts: Yes, Passive SI Active Intent and/or Plan: With Intent; With Plan; With Means to Carry Out  Homicidal Thoughts:Homicidal Thoughts: No   Sensorium  Memory:Immediate Fair; Recent Fair  Judgment:Impaired  Insight:Lacking   Executive Functions  Concentration:Fair  Attention Span:Fair  Recall:Fair  Fund of Knowledge:Fair  Language:Good   Psychomotor Activity  Psychomotor Activity:Psychomotor Activity: Normal   Assets  Assets:Communication Skills; Maldonado for Improvement; Social Support; Vocational/Educational   Sleep  Sleep:Sleep: Fair   Nutritional Assessment (For OBS and FBC admissions only) Has the patient had a  weight loss or gain of 10 pounds or more in the last 3 months?: No Has the patient had a decrease in food intake/or appetite?: No Does the patient have dental problems?: No Does the patient have eating habits or behaviors that may be indicators of an eating disorder including binging or inducing vomiting?: No Has the patient recently  lost weight without trying?: No Has the patient been eating poorly because of a decreased appetite?: No Malnutrition Screening Tool Score: 0    Physical Exam   Physical Exam Constitutional:      Appearance: Normal appearance. He is normal weight.  HENT:     Head: Normocephalic and atraumatic.  Pulmonary:     Effort: Pulmonary effort is normal.  Neurological:     Mental Status: He is alert.    Review of Systems  Constitutional: Negative for chills and fever.  Eyes: Negative for discharge.  Respiratory: Negative for cough.   Cardiovascular: Negative for chest pain.  Gastrointestinal: Negative for abdominal pain.  Musculoskeletal: Positive for back pain.       Back pain related to reported kidney surgery   Neurological: Negative for headaches.  Psychiatric/Behavioral: Negative for hallucinations, substance abuse and suicidal ideas. The patient is nervous/anxious. Blood pressure 118/83, pulse 97, temperature 98.2 F (36.8 C), temperature source Oral, resp. rate 16, SpO2 100 %. There is no height or weight on file to calculate BMI.  Demographic Factors:  Living alone  Loss Factors: Loss of significant relationship  Historical Factors: NA  Risk Reduction Factors:   Positive social support  Continued Clinical Symptoms:  Currently Psychotic  Cognitive Features That Contribute To Risk:  Thought constriction (tunnel vision)    Suicide Risk:  Mild:  Suicidal ideation of limited frequency, intensity, duration, and specificity.  There are no identifiable plans, no associated intent, mild dysphoria and related symptoms, good self-control (both  objective and subjective assessment), few other risk factors, and identifiable protective factors, including available and accessible social support.  Plan Of Care/Follow-up recommendations:  [facility]  [facility]  Estella HuskKatherine S Laubach, MD 05/12/2020, 2:40 PM

## 2020-05-12 NOTE — Discharge Instructions (Addendum)
[facility]

## 2020-05-12 NOTE — ED Notes (Signed)
Called report to Marylene Land, Charity fundraiser at [facility] to give nurse-to-nurse report prior to transport. Safe transport called.

## 2020-05-12 NOTE — ED Notes (Signed)
Pt given meal

## 2020-05-12 NOTE — ED Provider Notes (Signed)
Behavioral Health Admission H&P [facility] & OBS)  Date: 05/12/20 Patient Name: Connor Maldonado MRN: [id number] Chief Complaint: No chief complaint on file.  Chief Complaint/Presenting Problem: Pt reported, he's suicidal, pt asked his younger brother for his gun to commit suicide. Pt reported, since November 2021 he's been poked by a needle and feeling like he's being operated on by paramedics.  Diagnoses:  Final diagnoses:  Psychosis, unspecified psychosis type (HCC)    HPI: Connor Maldonado is a 30 y.o. male who presents to [facility] voluntarily to [facility] with his family due to SI and paranoia. On evaluation, patient is alert and oriented x 4. He is calm and cooperative. Affect is flat. Speech is decreased in volume and mumbled at times. Patient states "I don't really feel depressed, I just want to end it all." When a asked for clarification, he states that he wants to kill himself. He states that he asked his brother for a gun. Patient states that he feels that he has a chip implanted in his head that someone is using the chip to influence him to kill himself. He states that is not sure how the chip was implanted but thinks someone came into his house during the night and performed surgery to implant a chip. Patient states that he feels that there are cameras in his apartment and someone is monitoring him. Patient states that at night he feels that paramedics are coming into his house and sticking him with needles and performing surgery on his abdomen. States that he can feel them cutting and sticking him with needles. However, patient states he has vivid dreams, so he thinks he could be dreaming. Patient provides an unclear timeline. He states that he feels like cameras have been in his apartment for more than a year, but states "it never bothered me until I was living alone." States that he has been living alone since November, and since then has had time to think about cameras in his apartment. He states  that various family members have stayed with him over the past year, until November.   Patient reports that he drinks alcohol socially. States "I stopped drinking heavily two months ago." States that he used to have 4-5 drinks daily. He states that he had a couple of drinks with his brother this evening. On chart review, his alcohol use is mentioned in notes during primary care visits, but it does not appear that he has received treatment or been diagnosed with alcohol use disorder. He denies use of marijuana and other substances. UDS negative.  He reports that his mother passed away unexpectedly 2 years ago. States that he has been feeling "different" since then. She states that he participated in therapy after her death, but has received no other psychiatric care.    PHQ 2-9:   Flowsheet Row ED from 05/12/2020 in [facility]  C-SSRS RISK CATEGORY High Risk       Total Time spent with patient: 45 minutes  Musculoskeletal  Strength & Muscle Tone: within normal limits Gait & Station: normal Patient leans: N/A  Psychiatric Specialty Exam  Presentation General Appearance: Well Groomed  Eye Contact:Fair  Speech:Clear and Coherent  Speech Volume:Decreased  Handedness:No data recorded  Mood and Affect  Mood:Hopeless; Depressed; Anxious  Affect:Flat   Thought Process  Thought Processes:Disorganized  Descriptions of Associations:Loose  Orientation:Full (Time, Place and Person)  Thought Content:Paranoid Ideation; Delusions  Diagnosis of Schizophrenia or Schizoaffective disorder in past: No  Duration of Psychotic  Symptoms: Less than six months  Hallucinations:Hallucinations: Tactile; Other (comment) (he states that when he is sleeping he feels like some one is operating on his abdomen)  Ideas of Reference:Paranoia; Delusions  Suicidal Thoughts:Suicidal Thoughts: Yes, Active SI Active Intent and/or Plan: With Intent; With Plan; With Means to Carry  Out  Homicidal Thoughts:Homicidal Thoughts: No   Sensorium  Memory:Immediate Fair; Recent Fair  Judgment:Impaired  Insight:Lacking   Executive Functions  Concentration:Fair  Attention Span:Fair  Recall:Fair  Fund of Knowledge:Good  Language:Good   Psychomotor Activity  Psychomotor Activity:Psychomotor Activity: Decreased   Assets  Assets:Communication Skills; Desire for Improvement; Housing; Physical Health; Social Support; Talents/Skills; Vocational/Educational   Sleep  Sleep:Sleep: Poor   Nutritional Assessment (For OBS and FBC admissions only) Has the patient had a weight loss or gain of 10 pounds or more in the last 3 months?: No Has the patient had a decrease in food intake/or appetite?: No Does the patient have dental problems?: No Does the patient have eating habits or behaviors that may be indicators of an eating disorder including binging or inducing vomiting?: No Has the patient recently lost weight without trying?: No Has the patient been eating poorly because of a decreased appetite?: No Malnutrition Screening Tool Score: 0    Physical Exam Constitutional:      General: He is not in acute distress.    Appearance: He is not ill-appearing, toxic-appearing or diaphoretic.  Eyes:     Conjunctiva/sclera: Conjunctivae normal.     Pupils: Pupils are equal, round, and reactive to light.  Cardiovascular:     Rate and Rhythm: Normal rate.  Pulmonary:     Effort: Pulmonary effort is normal. No respiratory distress.  Musculoskeletal:        General: Normal range of motion.  Neurological:     Mental Status: He is alert and oriented to person, place, and time.  Psychiatric:        Behavior: Behavior is cooperative.        Thought Content: Thought content is paranoid and delusional. Thought content includes suicidal ideation. Thought content includes suicidal plan.    Review of Systems  Constitutional: Negative for chills, diaphoresis, fever,  malaise/fatigue and weight loss.  Respiratory: Negative for cough and shortness of breath.   Cardiovascular: Negative for chest pain.  Gastrointestinal: Negative for diarrhea, nausea and vomiting.  Neurological: Negative for dizziness, tremors, seizures and headaches.  Psychiatric/Behavioral: Positive for depression, hallucinations and suicidal ideas. Negative for memory loss. The patient is nervous/anxious and has insomnia.     Blood pressure 133/89, pulse (!) 102, temperature (!) 97.4 F (36.3 C), temperature source Tympanic, resp. rate 18, SpO2 100 %. There is no height or weight on file to calculate BMI.  Past Psychiatric History: Denies history of psychiatric treatment  Is the patient at risk to self? Yes  Has the patient been a risk to self in the past 6 months? No .    Has the patient been a risk to self within the distant past? No   Is the patient a risk to others? No   Has the patient been a risk to others in the past 6 months? No   Has the patient been a risk to others within the distant past? No   Past Medical History:  Past Medical History:  Diagnosis Date  . Allergy   . Wears contact lenses     Past Surgical History:  Procedure Laterality Date  . TYMPANOSTOMY TUBE PLACEMENT     childhood  .  WISDOM TOOTH EXTRACTION      Family History:  Family History  Problem Relation Age of Onset  . Kidney disease Mother        kidney trasplant  . Diabetes Mother   . Hypertension Father   . Heart disease Maternal Grandfather   . Cancer Maternal Grandfather        prostate  . Stroke Maternal Grandfather   . Heart disease Paternal Grandfather   . Diabetes Paternal Grandfather     Social History:  Social History   Socioeconomic History  . Marital status: Single    Spouse name: Not on file  . Number of children: Not on file  . Years of education: Not on file  . Highest education level: Not on file  Occupational History  . Not on file  Tobacco Use  . Smoking  status: Never Smoker  . Smokeless tobacco: Not on file  Substance and Sexual Activity  . Alcohol use: Yes    Alcohol/week: 2.0 standard drinks    Types: 2 Shots of liquor per week  . Drug use: No  . Sexual activity: Yes    Comment: same partner since high school  Other Topics Concern  . 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 Smoking Tobacco Use: Never Smoker  . Smokeless Tobacco Use: Unknown  Transportation Needs: Not on file    Last Labs:  No visits with results within 6 Month(s) from this visit.  Latest known visit with results is:  Office Visit on 10/03/2014  Component Date Value Ref Range Status  . HIV 1&2 Ab, 4th Generation 10/03/2014 NONREACTIVE  NONREACTIVE Final   Comment:   HIV-1 antigen and HIV-1/HIV-2 antibodies were not detected.  There is no laboratory evidence of HIV infection.   HIV-1/2 Antibody Diff        Not indicated. HIV-1 RNA, Qual TMA          Not indicated.     PLEASE NOTE: This information has been disclosed to you from records whose confidentiality may be protected by state law. If your state requires such protection, then the state law prohibits you from making any further disclosure of the information without the  specific written consent of the person to whom it pertains, or as otherwise permitted by law. A general authorization for the release of medical or other information is NOT sufficient for this purpose.   The performance of this assay has not been clinically validated in patients less than 23 years old.   For additional information please refer to http://education.[facility].com/faq/FAQ106.  (This link is being provided for informational/educational purposes only.)     . RPR Ser Ql 10/03/2014 NON  REAC  NON REAC Final  . TSH 10/03/2014 1.262  0.350 - 4.500 uIU/mL Final    Allergies: Patient has no known allergies.  PTA Medications: (Not in a hospital admission)   Medical Decision Making  Admission orders placed  Per Fransico Michael, [facility] patient has been accepted to [facility] pending discharges.  Patient states that he does not want to take medications.  Agitation Protocol -Zyprexa 10 mg IM or PO every 8 hours prn  Labs pending   Clinical Course as of 05/12/20 0546  Fri May 12, 2020  0545 POCT Urine Drug Screen - (ICup) UDS negative [JB]  0545 EKG 12-Lead Sinus tachycardia, rate 104 Otherwise normal ECG QTC 431 [JB]    Clinical Course User Index [JB] Jackelyn Poling, NP    Recommendations  Based on my evaluation the patient does not appear to have an emergency medical condition.  Jackelyn Poling, NP 05/12/20  3:46 AM

## 2020-05-13 LAB — PROLACTIN: Prolactin: 16.3 ng/mL — ABNORMAL HIGH (ref 4.0–15.2)

## 2021-06-03 ENCOUNTER — Ambulatory Visit (HOSPITAL_COMMUNITY)
Admission: EM | Admit: 2021-06-03 | Discharge: 2021-06-03 | Disposition: A | Discharge status: Home / Self Care | Payer: No Payment, Other | Attending: Psychiatry | Admitting: Psychiatry

## 2021-06-03 DIAGNOSIS — F329 Major depressive disorder, single episode, unspecified: Secondary | ICD-10-CM | POA: Insufficient documentation

## 2021-06-03 DIAGNOSIS — F320 Major depressive disorder, single episode, mild: Secondary | ICD-10-CM

## 2021-06-03 NOTE — BH Assessment (Signed)
Connor Maldonado reports that he has been having some family issues and reports that his mom died in 13-Jun-2017 from renal failure. Patient reports for the past couple of days he has been isolated at home and has not talked to family or friends. Patient reports today he expressed to his uncle that he was ready to talk and his uncle encouraged him to come here for an evaluation. 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 Patient denies SI, HI, AVH, paranoia and substance use. Patient reports he is sleeping and eating well. Patient was under the impression that he was going to see a counselor instead of [facility] for an assessment. Patient consented for TTS to obtain collateral from his uncle who is present with him in [facility]. Uncle reports that the patient entire family is worried about him because he has been isolated and not calling his family back. Uncle reports that patient has been driving around on "bald tires", not washing his clothes and not working. Uncle denies that patient expressed SI, HI, AVH but feels patient is a threat to himself because he is isolating and riding around with bad tires. NP is present during triage. NP and TTS expressed that pt did not meet inpt criteria and provided resources for outpatient services. TTS and NP discussed with pt and family that he can return at anytime if symptoms worsen.  ?

## 2021-06-03 NOTE — ED Provider Notes (Signed)
Behavioral Health Urgent Care Medical Screening Exam ? ?Patient Name: Connor Maldonado ?MRN: [id number] ?Date of Evaluation: 06/03/21 ?Chief Complaint:   ?Diagnosis:  ?Final diagnoses:  ?Current mild episode of major depressive disorder, unspecified whether recurrent ([place])  ? ?History of Present illness: Connor Maldonado is a 31 y.o. male patient presented to [facility] as a walk in  accompanied by his uncle with complaints of "my uncle brought me here to talk to someone". ? ?Royden Purl, 31 y.o., male patient seen face to face by this provider, consulted with Dr. Dwyane Dee; and chart reviewed on 06/03/21.   Per chart review patient was admitted to the continuous assessment unit for overnight observation on 05/09/2021. At that time he was having psychotic symptoms and was admitted to [facility]. He does not have any out patient services in place. He currently denies taking any medications or any health concerns. Patient reports that today he was at his uncles house and his uncle wanted him to come in for an evaluation.   ? ?During evaluation Connor Maldonado is patient.  He is well-groomed.  He is alert/oriented x4 and makes fair eye contact.  He is speaking in a clear time, moderate volume, normal pace.  He denies any concerns with sleep or appetite.  He endorses depression.  Admits he has self isolated and has low motivation.  He denies any concerns with sleep but admits he may be sleeping too much.  He denies AVH/HI.  Objectively he does not appear to be responding to internal/external stimuli.  He denies paranoid and delusional thought.  He is not psychotic.  He denies suicidal ideations.  He contracts for safety.  He is logical and was able to answer questions appropriately.  ? ?Patient allowed his uncle to be present during assessment.  Uncle states that patient has been withdrawn from the family.  He is driving around with "bald tires", he has rent that is due but is unemployed and not  concerned.  Patient's uncle states that he thinks it would be a good idea if patient was admitted.  Uncle was disappointed when told that patient does not meet criteria for inpatient psychiatric admission.  Discussed outpatient psychiatric services for medication management and therapy.  Patient is not interested in starting medications at this time.  Provided walk-in hours for behavioral health services on the second floor. ? ?At this time Connor Maldonado is educated and verbalizes understanding of mental health resources and other crisis services in the community. He is instructed to call 911 and present to the nearest emergency room should he experience any suicidal/homicidal ideation, auditory/visual/hallucinations, or detrimental worsening of his mental health condition.  ? ?Psychiatric Specialty Exam ? ?Presentation  ?General Appearance:Appropriate for Environment; Well Groomed ? ?Eye Contact:Fair ? ?Speech:Clear and Coherent; Normal Rate ? ?Speech Volume:Normal ? ?Handedness:Right ? ? ?Mood and Affect  ?Mood:Depressed ? ?Affect:Congruent ? ? ?Thought Process  ?Thought Processes:Coherent ? ?Descriptions of Associations:Intact ? ?Orientation:Full (Time, Place and Person) ? ?Thought Content:Logical ? Diagnosis of Schizophrenia or Schizoaffective disorder in past: No data recorded Duration of Psychotic Symptoms: No data recorded Hallucinations:None ? ?Ideas of Reference:None ? ?Suicidal Thoughts:No ? ?Homicidal Thoughts:No ? ? ?Sensorium  ?Memory:Immediate Good; Recent Good; Remote Good ? ?Judgment:Good ? ?Insight:Good ? ? ?Executive Functions  ?Concentration:Good ? ?Attention Span:Good ? ?Recall:Good ? ?Fund of [place] ? ?Language:Good ? ? ?Psychomotor Activity  ?Psychomotor Activity:Normal ? ? ?Assets  ?Assets:Desire for Improvement; Communication Skills; Housing; Catering manager; Physical Health; Resilience;  Social Support; Transportation ? ? ?Sleep  ?Sleep:Good ? ?Number of hours: No  data recorded ? ?No data recorded ? ?Physical Exam: ?Physical Exam ?Vitals and nursing note reviewed.  ?Constitutional:   ?   General: He is not in acute distress. ?   Appearance: Normal appearance. He is well-developed. He is not ill-appearing.  ?HENT:  ?   Head: Normocephalic and atraumatic.  ?Eyes:  ?   General:     ?   Right eye: No discharge.     ?   Left eye: No discharge.  ?   Conjunctiva/sclera: Conjunctivae normal.  ?Cardiovascular:  ?   Rate and Rhythm: Normal rate and regular rhythm.  ?   Heart sounds: No murmur heard. ?Pulmonary:  ?   Effort: Pulmonary effort is normal. No respiratory distress.  ?   Breath sounds: Normal breath sounds.  ?Abdominal:  ?   Palpations: Abdomen is soft.  ?   Tenderness: There is no abdominal tenderness.  ?Musculoskeletal:     ?   General: No swelling. Normal range of motion.  ?   Cervical back: Normal range of motion and neck supple.  ?Skin: ?   General: Skin is warm and dry.  ?   Capillary Refill: Capillary refill takes less than 2 seconds.  ?   Coloration: Skin is not jaundiced or pale.  ?Neurological:  ?   Mental Status: He is alert and oriented to person, place, and time.  ?Psychiatric:     ?   Attention and Perception: Attention and perception normal.     ?   Mood and Affect: Mood is depressed.     ?   Speech: Speech normal.     ?   Behavior: Behavior normal. Behavior is cooperative.     ?   Thought Content: Thought content normal.     ?   Cognition and Memory: Cognition normal.     ?   Judgment: Judgment normal.  ? ?Review of Systems  ?Constitutional: Negative.   ?HENT: Negative.    ?Eyes: Negative.   ?Respiratory: Negative.    ?Cardiovascular: Negative.   ?Genitourinary: Negative.   ?Musculoskeletal: Negative.   ?Skin: Negative.   ?Psychiatric/Behavioral:  Positive for depression.   ?Blood pressure (!) 155/94, pulse 98, temperature 98 ?F (36.7 ?C), temperature source Oral, resp. rate 18, SpO2 99 %. There is no height or weight on file to calculate  BMI. ? ?Musculoskeletal: ?Strength & Muscle Tone: within normal limits ?Gait & Station: normal ?Patient leans: N/A ? ? ?[facility] MSE Discharge Disposition for Follow up and Recommendations: ?Based on my evaluation the patient does not appear to have an emergency medical condition and can be discharged with resources and follow up care in outpatient services for Medication Management and Individual Therapy ? ?Discussed outpatient psychiatric services for medication management and therapy.  Patient is not interested in starting medications at this time.  Provided walk-in hours for behavioral health services on the second floor. ? ?No evidence of imminent risk to self or others at present.    ?Patient does not meet criteria for psychiatric inpatient admission. ?Discussed crisis plan, support from social network, calling 911, coming to the Emergency Department, and calling Suicide Hotline.  ? ?Revonda Humphrey, NP ?06/03/2021, 5:42 PM ? ?

## 2021-06-03 NOTE — Discharge Instructions (Addendum)
Please contact one of the following facilities to start medication management and therapy services:   [facility] [address] (SECOND FLOOR)  [place], [place]  [address] Phone: [phone]  [facility]  [address] [place], [place] [address] Phone: [phone]  [facility] [place]  [address]  [place], [place] [address]  [facility] [place]  [address]  [place], [place] [address] Phone: [phone]  

## 2021-06-03 NOTE — Progress Notes (Signed)
Connor Maldonado to be D/C'd Home per NP order. Discussed with the patient and all questions fully answered. An After Visit Summary was printed and given to the patient. Patient escorted out and D/C home via private auto.  ?Connor Maldonado  Marquis Lunch  ?06/03/2021 5:41 PM ?  ?   ?

## 2021-06-08 ENCOUNTER — Encounter (HOSPITAL_COMMUNITY): Payer: Self-pay | Admitting: Physician Assistant

## 2021-06-08 ENCOUNTER — Ambulatory Visit (INDEPENDENT_AMBULATORY_CARE_PROVIDER_SITE_OTHER): Payer: No Payment, Other | Admitting: Physician Assistant

## 2021-06-08 ENCOUNTER — Other Ambulatory Visit: Payer: Self-pay

## 2021-06-08 VITALS — BP 127/78 | HR 86 | Ht 72.0 in | Wt 164.0 lb

## 2021-06-08 DIAGNOSIS — Z8659 Personal history of other mental and behavioral disorders: Secondary | ICD-10-CM | POA: Diagnosis not present

## 2021-06-08 MED ORDER — ARIPIPRAZOLE 2 MG PO TABS
2.0000 mg | ORAL_TABLET | Freq: Every day | ORAL | 1 refills | Status: DC
Start: 2021-06-08 — End: 2021-08-30
  Filled 2021-06-08: qty 30, 30d supply, fill #0

## 2021-06-08 NOTE — Progress Notes (Addendum)
Psychiatric Initial Adult Assessment  ? ?Patient Identification: Connor Maldonado ?MRN:  [id number] ?Date of Evaluation:  06/08/2021 ?Referral Source: Follow up appointment following discharge from [facility] ?Chief Complaint:   ?Chief Complaint  ?Patient presents with  ? Medication Management  ? ?Visit Diagnosis:  ?  ICD-10-CM   ?1. History of schizophrenia  Z86.59 ARIPiprazole (ABILIFY) 2 MG tablet  ?  ? ? ?History of Present Illness:   ? ?Connor Michaelis. Maldonado is a 31 year old male with a past psychiatric history significant for schizophrenia who presents to [facility], accompanied by his cousin Connor Maldonado), for psychiatric evaluation and to establish psychiatric care. ? ?Patient reports that he was hospitalized last year due to depression and suicidal thoughts.  He reports that he was transferred to a hospital in [place] and was placed on medication, but never picked them up.  He reports that prior to discharge from the hospital in [place], he was given an injection he does not know the name of.  Patient presents today after being evaluated at [facility] a few days ago.  Per cousin, patient was last brought to [facility] and was given a diagnosis of schizophrenia.  Prior to admission to [facility], reported people coming through the window and operating on him.  As stated before, patient was hospitalized in [place] and after being discharged from the hospital, the patient appeared to be doing well.  Patient was able to get his own place (apartment) as well as employment. ? ?Today patient comes and states that patient has been isolating more.  He states that the patient also quit his job working at [employer] and [employer].  Patient denies depression or anxiety.  He denies feeling like people are out to get him and further denies auditory or visual hallucinations.  Patient does not sense any alarming symptoms  within himself.  Patient's cousin reports that patient is usually good about responding back to messages and phone calls from family; however, patient started to become more distant.  By the time the family caught up with the patient, patient had stopped working both jobs and had also fallen behind on the rent.  Patient states that the reason he had fallen behind on the rent was because he was engaging in self therapy through listening to podcast and isolating himself.  Patient states that these audio recordings were that of Connor Connor Maldonado.  He states that he let his rent slide until the point where he could pay the money without losing his place.  He reports that he was trying to mentally prepare himself for what is next in his endeavors. ? ?Patient denies other instances of past hospitalization due to mental health besides his stint at the hospital in [place].  Patient endorses past history of suicidal ideations stating that he had vivid thoughts of harming himself characterized by slitting wrist in the bathtub.  Patient reports that he never once attempted.  Patient denies a past history of self-harm but states that he once tried to put a pen in his wrist.  A PHQ-9 screen and GAD-7 screen performed today were both unremarkable with the patient scoring a 0. ? ?Patient is alert and oriented x4, calm, cooperative, and fully engaged in conversation during the encounter.  Patient denies suicidal or homicidal ideations.  He further denies auditory or visual hallucinations and does not appear to be responding to internal/external stimuli.  Patient endorses receiving  a good amount of sleep.  Patient endorses normal appetite.  Patient states that he has been drinking alcohol in the past 2 to 3 months.  Patient denies tobacco use.  Patient further denies illicit drug use and states that he has never used drugs in the past. ? ?Associated Signs/Symptoms: ?Depression Symptoms:  psychomotor agitation ?(Hypo)  Manic Symptoms:   None ?Anxiety Symptoms:  Social Anxiety, ?Psychotic Symptoms:   None ?PTSD Symptoms: ?Had a traumatic exposure:  Patient reports that his mother passing was traumatic. Per patient's cousin, patient's mother was an individual that was in and out of the hospital a lot. He states that the patient's mother relied on the patient. Patient also has an autistic nephew that relies on him heavily. Patient's cousin informed provider that the man he thought with his father for the longest time was not his father.  Patient's cousin also states that the patient had recently found out that an incident miss murder and resolving a woman was his sister, affect of the patient did not know for the longest time.  Prior to patient's mother's passing, she had renal failure and was on dialysis between the time the patient was 2 to 31 years of age.  Patient was preparing to donate his kidney to his mother prior to his mother becoming seriously ill.  Patient's cousin states that the patient also had a pastor that he was really close to that passed away. ?Had a traumatic exposure in the last month:  N/A ?Re-experiencing:  Intrusive Thoughts ?Hypervigilance:  No ?Hyperarousal:  Emotional Numbness/Detachment ?Sleep ?Avoidance:  None ? ?Past Psychiatric History:  ?Past history of being diagnosed with schizophrenia ? ?Previous Psychotropic Medications: Yes  ? ?Substance Abuse History in the last 12 months:  No. ? ?Consequences of Substance Abuse: ?Negative ? ?Past Medical History:  ?Past Medical History:  ?Diagnosis Date  ? Allergy   ? Wears contact lenses   ?  ?Past Surgical History:  ?Procedure Laterality Date  ? TYMPANOSTOMY TUBE PLACEMENT    ? childhood  ? WISDOM TOOTH EXTRACTION    ? ? ?Family Psychiatric History:  ?Brother - Patient reports that he was given a diagnosis of bipolar disorder after experiencing a manic episode lasted a few weeks.  Patient's brother has not had any issues related to bipolar disorder ?Connor Maldonado - Psychiatric diagnosis unknown patient reports that he killed himself. ? ?Family History:  ?Family History  ?Problem Relation Age of Onset  ? Kidney disease Mother   ?     kidney trasplant  ? Diabetes Mother   ? Hypertension Father   ? Heart disease Maternal Grandfather   ? Cancer Maternal Grandfather   ?     prostate  ? Stroke Maternal Grandfather   ? Heart disease Paternal Grandfather   ? Diabetes Paternal Grandfather   ? ? ?Social History:   ?Social History  ? ?Socioeconomic History  ? Marital status: Single  ?  Spouse name: Not on file  ? Number of children: Not on file  ? Years of education: Not on file  ? Highest education level: Not on file  ?Occupational History  ? Not on file  ?Tobacco Use  ? Smoking status: Never  ? Smokeless tobacco: Not on file  ?Substance and Sexual Activity  ? Alcohol use: Yes  ?  Alcohol/week: 2.0 standard drinks  ?  Types: 2 Shots of liquor per week  ? Drug use: No  ? Sexual activity: Yes  ?  Comment: same partner  since high school  ?Other Topics Concern  ? Not on file  ?Social History Narrative  ? 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 ?Social Determinants of Health  ? ?Financial Resource Strain: Not on file  ?Food Insecurity: Not on file  ?Transportation Needs: Not on file  ?Physical Activity: Not on file  ?Stress: Not on file  ?Social Connections: Not on file  ? ? ?Additional Social History:  ?Patient is currently unemployed.  Patient was working with [employer] and [employer] as a means of source of income.  Patient endorses social support.  Patient's car is currently in the shop but states that he does have a motor transport.  Patient endorses housing.  ? ?Allergies:  No Known Allergies ? ?Metabolic Disorder Labs: ?Lab Results  ?Component Value Date  ? HGBA1C 4.5 (L) 05/12/2020  ? MPG 82.45 05/12/2020  ? ?Lab Results  ?Component Value Date  ?  PROLACTIN 16.3 (H) 05/12/2020  ? ?Lab Results  ?Component Value Date  ? CHOL 207 (H) 05/12/2020  ? TRIG 50 05/12/2020  ? HDL 62 05/12/2020  ? CHOLHDL 3.3 05/12/2020  ? VLDL 10 05/12/2020  ? LDLCALC 135 (H) 03/25

## 2021-07-06 ENCOUNTER — Encounter (HOSPITAL_COMMUNITY): Payer: Self-pay | Admitting: Physician Assistant

## 2021-07-20 ENCOUNTER — Other Ambulatory Visit: Payer: Self-pay

## 2021-08-30 ENCOUNTER — Ambulatory Visit (INDEPENDENT_AMBULATORY_CARE_PROVIDER_SITE_OTHER): Payer: No Payment, Other | Admitting: Student in an Organized Health Care Education/Training Program

## 2021-08-30 ENCOUNTER — Encounter (HOSPITAL_COMMUNITY): Payer: Self-pay | Admitting: Student in an Organized Health Care Education/Training Program

## 2021-08-30 ENCOUNTER — Other Ambulatory Visit: Payer: Self-pay

## 2021-08-30 DIAGNOSIS — Z8659 Personal history of other mental and behavioral disorders: Secondary | ICD-10-CM

## 2021-08-30 DIAGNOSIS — F320 Major depressive disorder, single episode, mild: Secondary | ICD-10-CM

## 2021-08-30 MED ORDER — FLUOXETINE HCL 10 MG PO CAPS
10.0000 mg | ORAL_CAPSULE | Freq: Every day | ORAL | 0 refills | Status: AC
Start: 2021-08-30 — End: 2021-09-29
  Filled 2021-08-30: qty 30, 30d supply, fill #0

## 2021-08-30 MED ORDER — ARIPIPRAZOLE 2 MG PO TABS
2.0000 mg | ORAL_TABLET | Freq: Every day | ORAL | 0 refills | Status: AC
  Filled 2021-08-30: qty 30, 30d supply, fill #0

## 2021-08-30 NOTE — Progress Notes (Signed)
Luke MD/PA/NP OP Progress Note  08/30/2021 11:28 AM Connor Maldonado  MRN:  [id number]  Chief Complaint: No chief complaint on file.  HPI:  Connor Maldonado is a 31 yr old male who presents for follow up and medication management.  PPHx is significant for Schizophrenia and 1 prior hospitalization [facility] 04/2020).   He presents today with his partner.  She reports that they first met after his appointment here in April because she is friends with his cousin who brought him here. She reports that since starting the Abilify she has noticed a significant improvement in him.  He reports that he has not noticed a significant difference.  He reports that for his whole life his mother was sick and so when she died he became significantly worse and they both think that this could have been the trigger.  Discussed that if he had been having depressive symptoms that were being untreated for years and then had a significant acute stressor (the passing of his mother) his diagnosis could potentially be MDD with psychosis as opposed to schizophrenia.  Discussed starting an antidepressant, Prozac, and he was agreeable to this.  He reports no SI, HI, or AVH.  He reports his appetite is doing good.  He reports his sleep is doing good.  Discussed with him what to do in the event of a future crisis.  Discussed that he can return to [facility], go to [facility], go to the nearest ED, or call 911 or [phone].   He reported understanding and had no concerns.   Visit Diagnosis:    ICD-10-CM   1. Current mild episode of major depressive disorder, unspecified whether recurrent (HCC)  F32.0 FLUoxetine (PROZAC) 10 MG capsule    2. History of schizophrenia  Z86.59 ARIPiprazole (ABILIFY) 2 MG tablet      Past Psychiatric History: Schizophrenia and 1 prior hospitalization ([place] 04/2020)  Past Medical History:  Past Medical History:  Diagnosis Date   Allergy    Wears contact lenses     Past Surgical History:   Procedure Laterality Date   TYMPANOSTOMY TUBE PLACEMENT     childhood   WISDOM TOOTH EXTRACTION      Family Psychiatric History: Brother - Patient reports that he was given a diagnosis of bipolar disorder after experiencing a manic episode lasted a few weeks.  Patient's brother has not had any issues related to bipolar disorder [place] - Psychiatric diagnosis unknown patient reports that he killed himself.  Family History:  Family History  Problem Relation Age of Onset   Kidney disease Mother        kidney trasplant   Diabetes Mother    Hypertension Father    Heart disease Maternal Grandfather    Cancer Maternal Grandfather        prostate   Stroke Maternal Grandfather    Heart disease Paternal Grandfather    Diabetes Paternal Grandfather     Social History:  Social History   Socioeconomic History   Marital status: Single    Spouse name: Not on file   Number of children: Not on file   Years of education: Not on file   Highest education level: Not on file  Occupational History   Not on file  Tobacco Use   Smoking status: Never   Smokeless tobacco: Not on file  Substance and Sexual Activity   Alcohol use: Yes    Alcohol/week: 2.0 standard drinks of alcohol    Types: 2 Shots of liquor  per week   Drug use: No   Sexual activity: Yes    Comment: same partner since high school  Other Topics Concern   Not on file  Social History Narrative   Single, living with his brother, exercise 5 days per week at [employer], weights, walking;  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   Social Determinants of Health   Financial Resource Strain: Not on file  Food Insecurity: Not on file  Transportation Needs: Not on file  Physical Activity: Not on file  Stress: Not on file  Social Connections: Not on file    Allergies: No Known Allergies  Metabolic Disorder Labs: Lab Results  Component Value Date   HGBA1C 4.5 (L) 05/12/2020   MPG  82.45 05/12/2020   Lab Results  Component Value Date   PROLACTIN 16.3 (H) 05/12/2020   Lab Results  Component Value Date   CHOL 207 (H) 05/12/2020   TRIG 50 05/12/2020   HDL 62 05/12/2020   CHOLHDL 3.3 05/12/2020   VLDL 10 05/12/2020   LDLCALC 135 (H) 05/12/2020   LDLCALC 66 05/25/2013   Lab Results  Component Value Date   TSH 2.589 05/12/2020   TSH 1.262 10/03/2014    Therapeutic Level Labs: No results found for: "LITHIUM" No results found for: "VALPROATE" No results found for: "CBMZ"  Current Medications: Current Outpatient Medications  Medication Sig Dispense Refill   FLUoxetine (PROZAC) 10 MG capsule Take 1 capsule (10 mg total) by mouth daily. 30 capsule 0   ARIPiprazole (ABILIFY) 2 MG tablet Take 1 tablet (2 mg total) by mouth daily. 30 tablet 0   No current facility-administered medications for this visit.     Musculoskeletal: Strength & Muscle Tone: within normal limits Gait & Station: normal Patient leans: N/A  Psychiatric Specialty Exam: Review of Systems  Respiratory:  Negative for shortness of breath.   Cardiovascular:  Negative for chest pain.  Gastrointestinal:  Negative for abdominal pain, constipation, diarrhea, nausea and vomiting.  Neurological:  Negative for dizziness, weakness and headaches.  Psychiatric/Behavioral:  Positive for dysphoric mood. Negative for hallucinations, sleep disturbance and suicidal ideas. The patient is not nervous/anxious.     There were no vitals taken for this visit.There is no height or weight on file to calculate BMI.  General Appearance: Casual and Fairly Groomed  Eye Contact:  Fair  Speech:  Clear and Coherent and Slow  Volume:  Normal  Mood:  Dysphoric  Affect:  Congruent and Flat  Thought Process:  Coherent and Goal Directed  Orientation:  Full (Time, Place, and Person)  Thought Content: Logical   Suicidal Thoughts:  No  Homicidal Thoughts:  No  Memory:  Immediate;   Good Recent;   Good  Judgement:   Fair  Insight:  Fair  Psychomotor Activity:  Normal  Concentration:  Concentration: Good and Attention Span: Good  Recall:  Good  Fund of Knowledge: Good  Language: Good  Akathisia:  Negative  Handed:  Left  AIMS (if indicated): done AIMS=0  Assets:  Communication Skills Desire for Improvement Physical Health Resilience Social Support  ADL's:  Intact  Cognition: WNL  Sleep:  Fair   Screenings: GAD-7    Physiological scientist Office Visit from 06/08/2021 in [facility]  Total GAD-7 Score 0      PHQ2-9    [place] Office Visit from 06/08/2021 in [place]  PHQ-2 Total Score 0  [place] Office Visit from 06/08/2021 in [facility] ED from 05/12/2020 in [place] CATEGORY Low Risk High Risk        Assessment and Plan: Lashawn Orrego is a 31 yr old male who presents for follow up and medication management.  PPHx is significant for Schizophrenia and 1 prior hospitalization [facility] 04/2020).   Jatinder has responded well to the medication per self report and partner.  They both report he continues to have depressive symptoms so we will trial Prozac.  As his psychotic symptoms started after the death of his mother and having years of symptoms be untreated there is possibility that his diagnosis is MDD w/psychotic features vs Schizophrenia and MDD.  He will follow up in 4 weeks.   Schizophrenia  MDD:   (r/o MDD, Severe w/psychosis) -Continue Abilify 2 mg daily for psychosis and augmentation.  30 tablets with 0 refills. -Start Prozac 10 mg daily for depression.  30 tablets with 0 refills.   Collaboration of Care:   Patient/Guardian was advised Release of Information must be obtained prior to any record release in order to collaborate their care with an outside provider. Patient/Guardian was advised if they have not already done so to  contact the registration department to sign all necessary forms in order for [place] to release information regarding their care.   Consent: Patient/Guardian gives verbal consent for treatment and assignment of benefits for services provided during this visit. Patient/Guardian expressed understanding and agreed to proceed.    Briant Cedar, MD 08/30/2021, 11:28 AM

## 2021-10-16 ENCOUNTER — Telehealth (INDEPENDENT_AMBULATORY_CARE_PROVIDER_SITE_OTHER)
Payer: Federal, State, Local not specified - Other | Admitting: Student in an Organized Health Care Education/Training Program

## 2021-10-16 DIAGNOSIS — Z8659 Personal history of other mental and behavioral disorders: Secondary | ICD-10-CM

## 2021-10-16 NOTE — Progress Notes (Signed)
Virtual Visit via Video Note  I connected with Connor Maldonado on 10/16/21 at  1:30 PM EDT by a video enabled telemedicine application and verified that I am speaking with the correct person using two identifiers.  Location: Patient: Home Provider: [facility] Office   I discussed the limitations of evaluation and management by telemedicine and the availability of in person appointments. The patient expressed understanding and agreed to proceed.  History of Present Illness: Connor Maldonado is a 31 yr old male who presents for follow up and medication management.  PPHx is significant for Schizophrenia and 1 prior hospitalization [facility] 04/2020).  He reports that he stopped taking his Abilify and Prozac approximately 3 weeks ago.  He reports that he has not noticed any negative effects from this and reports feeling better since stopping his medications.  He reports that Connor Maldonado (possible partner) also has noticed a positive improvement since stopping his medications.  He is not interested in restarting any medications at this time.  He reports that he does have issues with focus and that Connor Maldonado had talked with him about Concerta or something similar.  Discussed with him that he would need to undergo ADHD testing at the [facility] health.  Discussed with him that if testing does reveal he has ADHD we could discuss medications at that time.  He reported understanding and states he will look into this further.  He reports no SI, HI, or AVH.  He reports his sleep was been fine.  He reports his appetite has been doing fine.  He reports his mood has been stable.  He reports has not had any symptoms of depression or anxiety.  We will schedule follow-up appointment in 2 months to ensure there are no changes in this time.     Observations/Objective:  Psychiatric Specialty Exam:   Review of Systems  Respiratory:  Negative for cough and shortness of breath.   Cardiovascular:  Negative for chest  pain.  Gastrointestinal:  Negative for abdominal pain, constipation, diarrhea, nausea and vomiting.  Neurological:  Negative for weakness and headaches.  Psychiatric/Behavioral:  Negative for dysphoric mood, hallucinations, sleep disturbance and suicidal ideas. The patient is not nervous/anxious.     There were no vitals taken for this visit.There is no height or weight on file to calculate BMI.  General Appearance: Casual and Fairly Groomed  Eye Contact:  Good  Speech:  Clear and Coherent and Normal Rate  Volume:  Normal  Mood:   "ok"  Affect:   mildly flat  Thought Process:  Coherent and Goal Directed  Orientation:  Full (Time, Place, and Person)  Thought Content:  Logical  Suicidal Thoughts:  No  Homicidal Thoughts:  No  Memory:  Immediate;   Good Recent;   Good  Judgement:  Fair  Insight:  Fair  Psychomotor Activity:  Normal  Concentration:  Concentration: Good and Attention Span: Good  Recall:  Good  Fund of Knowledge:  Fair  Language:  Fair  Akathisia:  Negative  Handed:  Left  AIMS (if indicated):     Assets:  Communication Skills Physical Health Resilience Social Support  ADL's:  Intact  Cognition:  WNL  Sleep:   good     Assessment and Plan:  Connor Maldonado is a 31 yr old male who presents for follow up and medication management.  PPHx is significant for Schizophrenia and 1 prior hospitalization [facility] 04/2020).   Burleigh's stopped taking his medications approximately 3 weeks ago and reports that  he is doing fine.  At this time he is not interested in restarting any medications.  He will look into ADHD testing to see if he wants to pursue that further.  He will have a follow-up appointment in 2 months to ensure there are no changes during that time.   Schizophrenia  MDD:   (r/o MDD, Severe w/psychosis): Already stopped taking the Abilify and Prozac about 3 weeks ago.  At this time he is not interested in restarting any medications.   Follow Up  Instructions:    I discussed the assessment and treatment plan with the patient. The patient was provided an opportunity to ask questions and all were answered. The patient agreed with the plan and demonstrated an understanding of the instructions.   The patient was advised to call back or seek an in-person evaluation if the symptoms worsen or if the condition fails to improve as anticipated.  I provided 8 minutes of non-face-to-face time during this encounter.   Lauro Franklin, MD

## 2021-12-18 ENCOUNTER — Telehealth (INDEPENDENT_AMBULATORY_CARE_PROVIDER_SITE_OTHER): Payer: No Payment, Other | Admitting: Student in an Organized Health Care Education/Training Program

## 2021-12-18 DIAGNOSIS — Z8659 Personal history of other mental and behavioral disorders: Secondary | ICD-10-CM | POA: Diagnosis not present

## 2021-12-18 DIAGNOSIS — F320 Major depressive disorder, single episode, mild: Secondary | ICD-10-CM

## 2021-12-18 NOTE — Progress Notes (Signed)
Virtual Visit via Video Note  I connected with Connor Maldonado on 12/18/21 at  1:00 PM EDT by a video enabled telemedicine application and verified that I am speaking with the correct person using two identifiers.  Location: Patient: Home Provider: [facility]   I discussed the limitations of evaluation and management by telemedicine and the availability of in person appointments. The patient expressed understanding and agreed to proceed.  History of Present Illness:  Connor Maldonado is a 31 yr old male who presents via virtual video visit for follow up and medication management.  PPHx is significant for Depression and history of Psychosis and 1 prior hospitalization [facility] 04/2020).  He reports that he is still doing good.  He reports his mood has remained mostly stable.  He reports he has had the occasional down day but states he does not stay in this state for long.  Discussed potentially restarting medications and he reports that at this time he does not feel like he needs them and is doing well.  He did ask to make a follow-up so that should things change and he want to restart medication he has an appointment.  Discussed with him that if he is in a crisis he can return to the [facility] 24/7 and he reported understanding.  He reports no SI, HI, or AVH.  He reports sleep is good.  He reports appetite is doing good.  He reports no other concerns at present.  He will return for follow-up in approximately 2 to 3 months.      Observations/Objective:  Psychiatric Specialty Exam: Physical Exam Constitutional:      General: He is not in acute distress.    Appearance: Normal appearance. He is normal weight. He is not ill-appearing or toxic-appearing.  HENT:     Head: Normocephalic and atraumatic.  Pulmonary:     Effort: Pulmonary effort is normal.  Musculoskeletal:        General: Normal range of motion.  Neurological:     General: No focal deficit present.     Mental Status: He is  alert.     Review of Systems  Respiratory:  Negative for cough and shortness of breath.   Cardiovascular:  Negative for chest pain.  Gastrointestinal:  Negative for abdominal pain, constipation, diarrhea, nausea and vomiting.  Neurological:  Negative for weakness and headaches.  Psychiatric/Behavioral:  Negative for dysphoric mood, hallucinations, sleep disturbance and suicidal ideas. The patient is not nervous/anxious.     There were no vitals taken for this visit.There is no height or weight on file to calculate BMI.  General Appearance: Casual and Fairly Groomed  Eye Contact:  Good  Speech:  Clear and Coherent and Normal Rate  Volume:  Normal  Mood:  Euthymic  Affect:  Appropriate and Congruent  Thought Process:  Coherent and Goal Directed  Orientation:  Full (Time, Place, and Person)  Thought Content:  WDL and Logical  Suicidal Thoughts:  No  Homicidal Thoughts:  No  Memory:  Immediate;   Good Recent;   Good  Judgement:  Good  Insight:  Fair  Psychomotor Activity:  Normal  Concentration:  Concentration: Good and Attention Span: Good  Recall:  Good  Fund of Knowledge:  Good  Language:  Good  Akathisia:  Negative  Handed:  Left  AIMS (if indicated):     Assets:  Communication Skills Intimacy Physical Health Resilience Social Support  ADL's:  Intact  Cognition:  WNL  Sleep:   Good  Assessment and Plan:  Connor Maldonado is a 31 yr old male who presents via virtual video visit for follow up and medication management.  PPHx is significant for Depression and history of Psychosis and 1 prior hospitalization [facility] 04/2020).   Connor Maldonado is reporting that he is still doing well without medication.  He is not interested in restarting medication at this time.  He is interested in having a future appointment so that he is still connected with resources if needed.  He will return for follow-up in 2-3 months.   Depression  Hx of Psychosis: -At this time is not  interested in medication.    Follow Up Instructions:    I discussed the assessment and treatment plan with the patient. The patient was provided an opportunity to ask questions and all were answered. The patient agreed with the plan and demonstrated an understanding of the instructions.   The patient was advised to call back or seek an in-person evaluation if the symptoms worsen or if the condition fails to improve as anticipated.  I provided 8 minutes of non-face-to-face time during this encounter.   Briant Cedar, MD

## 2022-03-20 ENCOUNTER — Telehealth (HOSPITAL_COMMUNITY): Payer: Self-pay | Admitting: Student in an Organized Health Care Education/Training Program

## 2022-12-27 ENCOUNTER — Other Ambulatory Visit (HOSPITAL_COMMUNITY): Payer: Self-pay

## 2023-01-03 ENCOUNTER — Encounter (HOSPITAL_COMMUNITY): Payer: Self-pay

## 2023-01-03 ENCOUNTER — Ambulatory Visit (HOSPITAL_COMMUNITY)
Admission: EM | Admit: 2023-01-03 | Discharge: 2023-01-03 | Disposition: A | Discharge status: Home / Self Care | Payer: Self-pay | Attending: Physician Assistant | Admitting: Physician Assistant

## 2023-01-03 DIAGNOSIS — R899 Unspecified abnormal finding in specimens from other organs, systems and tissues: Secondary | ICD-10-CM | POA: Insufficient documentation

## 2023-01-03 DIAGNOSIS — E8809 Other disorders of plasma-protein metabolism, not elsewhere classified: Secondary | ICD-10-CM | POA: Insufficient documentation

## 2023-01-03 LAB — COMPREHENSIVE METABOLIC PANEL
ALT: 15 U/L (ref 0–44)
AST: 19 U/L (ref 15–41)
Albumin: 3.9 g/dL (ref 3.5–5.0)
Alkaline Phosphatase: 48 U/L (ref 38–126)
Anion gap: 7 (ref 5–15)
BUN: 10 mg/dL (ref 6–20)
CO2: 27 mmol/L (ref 22–32)
Calcium: 9.2 mg/dL (ref 8.9–10.3)
Chloride: 104 mmol/L (ref 98–111)
Creatinine, Ser: 1.14 mg/dL (ref 0.61–1.24)
GFR, Estimated: >60 mL/min (ref >60)
Glucose, Bld: 77 mg/dL (ref 70–99)
Potassium: 3.9 mmol/L (ref 3.5–5.1)
Sodium: 138 mmol/L (ref 135–145)
Total Bilirubin: 0.9 mg/dL (ref <1.2)
Total Protein: 6.8 g/dL (ref 6.5–8.1)

## 2023-01-03 LAB — CBC WITH DIFFERENTIAL/PLATELET
Abs Immature Granulocytes: 0.01 10*3/uL (ref 0.00–0.07)
Basophils Absolute: 0.1 10*3/uL (ref 0.0–0.1)
Basophils Relative: 2 %
Eosinophils Absolute: 0.1 10*3/uL (ref 0.0–0.5)
Eosinophils Relative: 2 %
HCT: 44.2 % (ref 39.0–52.0)
Hemoglobin: 15.2 g/dL (ref 13.0–17.0)
Immature Granulocytes: 0 %
Lymphocytes Relative: 31 %
Lymphs Abs: 1.1 10*3/uL (ref 0.7–4.0)
MCH: 30.8 pg (ref 26.0–34.0)
MCHC: 34.4 g/dL (ref 30.0–36.0)
MCV: 89.5 fL (ref 80.0–100.0)
Monocytes Absolute: 0.3 10*3/uL (ref 0.1–1.0)
Monocytes Relative: 7 %
Neutro Abs: 2 10*3/uL (ref 1.7–7.7)
Neutrophils Relative %: 58 %
Platelets: 246 10*3/uL (ref 150–400)
RBC: 4.94 MIL/uL (ref 4.22–5.81)
RDW: 11.7 % (ref 11.5–15.5)
WBC: 3.4 10*3/uL — ABNORMAL LOW (ref 4.0–10.5)
nRBC: 0 % (ref 0.0–0.2)

## 2023-01-03 LAB — POCT URINALYSIS DIP (MANUAL ENTRY)
Bilirubin, UA: NEGATIVE
Blood, UA: NEGATIVE
Glucose, UA: NEGATIVE mg/dL
Ketones, POC UA: NEGATIVE mg/dL
Leukocytes, UA: NEGATIVE
Nitrite, UA: NEGATIVE
Protein Ur, POC: NEGATIVE mg/dL
Spec Grav, UA: 1.015 (ref 1.010–1.025)
Urobilinogen, UA: 0.2 U/dL
pH, UA: 6 (ref 5.0–8.0)

## 2023-01-03 LAB — PREALBUMIN: Prealbumin: 31 mg/dL (ref 18–38)

## 2023-01-03 NOTE — Discharge Instructions (Signed)
I will contact you with your blood work results once I have them if they are abnormal.  Try to increase the amount of protein that you are eating in your diet.  If you have any abnormal symptoms including swelling in your legs, shortness of breath, chest pain, nausea/vomiting you need to be seen immediately.

## 2023-01-03 NOTE — ED Triage Notes (Signed)
Pt presents for a follow-up on blood work. Pt states he has donated plasma on two different occasion and blood has come back abnormal.

## 2023-01-03 NOTE — ED Provider Notes (Signed)
[facility]    CSN: [id number] Arrival date & time: 01/03/23  0848      History   Chief Complaint Chief Complaint  Patient presents with   Follow-up    HPI Connor Maldonado is a 32 y.o. male.   Patient presents today for evaluation of hypoalbuminemia.  For the past 3 months he has been giving plasma weekly.  Generally his blood work has been normal but for the past few times he has gone (over the past 3 weeks) he was noted to have low albumin and total protein levels.  They instructed him to follow-up with a provider to investigate this and obtain blood work.  Reports that he is eating normally and denies any dietary changes.  He does not take medication on regular basis.  Denies any history of kidney or liver disease.  He denies any symptoms including swelling, nausea, vomiting, chest pain, shortness of breath.    Past Medical History:  Diagnosis Date   Allergy    Wears contact lenses     Patient Active Problem List   Diagnosis Date Noted   RHINITIS, ALLERGIC 04/17/2006    Past Surgical History:  Procedure Laterality Date   TYMPANOSTOMY TUBE PLACEMENT     childhood   WISDOM TOOTH EXTRACTION         Home Medications    Prior to Admission medications   Medication Sig Start Date End Date Taking? Authorizing Provider  ARIPiprazole (ABILIFY) 2 MG tablet Take 1 tablet (2 mg total) by mouth daily. 08/30/21 09/29/21  Lauro Franklin, MD  FLUoxetine (PROZAC) 10 MG capsule Take 1 capsule (10 mg total) by mouth daily. 08/30/21 09/29/21  Lauro Franklin, MD    Family History Family History  Problem Relation Age of Onset   Kidney disease Mother        kidney trasplant   Diabetes Mother    Hypertension Father    Heart disease Maternal Grandfather    Cancer Maternal Grandfather        prostate   Stroke Maternal Grandfather    Heart disease Paternal Grandfather    Diabetes Paternal Grandfather     Social History Social History   Tobacco  Use   Smoking status: Never  Substance Use Topics   Alcohol use: Yes    Alcohol/week: 2.0 standard drinks of alcohol    Types: 2 Shots of liquor per week   Drug use: No     Allergies   Patient has no known allergies.   Review of Systems Review of Systems  Constitutional:  Negative for activity change, appetite change, fatigue and fever.  Respiratory:  Negative for shortness of breath.   Cardiovascular:  Negative for chest pain, palpitations and leg swelling.  Gastrointestinal:  Negative for abdominal pain, diarrhea, nausea and vomiting.  Genitourinary:  Negative for frequency and urgency.     Physical Exam Triage Vital Signs ED Triage Vitals [01/03/23 0948]  Encounter Vitals Group     BP 120/76     Systolic BP Percentile      Diastolic BP Percentile      Pulse Rate 71     Resp 18     Temp 98.6 F (37 C)     Temp Source Oral     SpO2 96 %     Weight      Height      Head Circumference      Peak Flow      Pain Score  Pain Loc      Pain Education      Exclude from Growth Chart    No data found.  Updated Vital Signs BP 120/76 (BP Location: Left Arm)   Pulse 71   Temp 98.6 F (37 C) (Oral)   Resp 18   SpO2 96%   Visual Acuity Right Eye Distance:   Left Eye Distance:   Bilateral Distance:    Right Eye Near:   Left Eye Near:    Bilateral Near:     Physical Exam Vitals reviewed.  Constitutional:      General: He is awake.     Appearance: Normal appearance. He is well-developed. He is not ill-appearing.     Comments: Very pleasant male appears stated age in no acute distress sitting comfortably in exam room  HENT:     Head: Normocephalic and atraumatic.  Cardiovascular:     Rate and Rhythm: Normal rate and regular rhythm.     Heart sounds: Normal heart sounds, S1 normal and S2 normal. No murmur heard. Pulmonary:     Effort: Pulmonary effort is normal.     Breath sounds: Normal breath sounds. No stridor. No wheezing, rhonchi or rales.      Comments: Clear to auscultation bilaterally Musculoskeletal:     Right lower leg: No edema.     Left lower leg: No edema.  Neurological:     Mental Status: He is alert.  Psychiatric:        Behavior: Behavior is cooperative.      UC Treatments / Results  Labs (all labs ordered are listed, but only abnormal results are displayed) Labs Reviewed  CBC WITH DIFFERENTIAL/PLATELET  COMPREHENSIVE METABOLIC PANEL  PREALBUMIN  MICROALBUMIN / CREATININE URINE RATIO  POCT URINALYSIS DIP (MANUAL ENTRY)    EKG   Radiology No results found.  Procedures Procedures (including critical care time)  Medications Ordered in UC Medications - No data to display  Initial Impression / Assessment and Plan / UC Course  I have reviewed the triage vital signs and the nursing notes.  Pertinent labs & imaging results that were available during my care of the patient were reviewed by me and considered in my medical decision making (see chart for details).     Patient is well-appearing, afebrile, nontoxic, nontachycardic.  No physical exam findings of fluid overload.  Basic blood work including CBC, CMP, prealbumin, UA, microalbumin/creatinine ratio obtained and pending.  Patient does not currently have a primary care provider so we will try to the PCP assistance.  We discussed that if he has any urinary frequency, urgency, swelling, shortness of breath he needs to be seen immediately.  We will contact him with his blood work with additional recommendations.  Strict return precautions given.  All questions answered to patient satisfaction.  Final Clinical Impressions(s) / UC Diagnoses   Final diagnoses:  Hypoalbuminemia  Abnormal laboratory test result     Discharge Instructions      I will contact you with your blood work results once I have them if they are abnormal.  Try to increase the amount of protein that you are eating in your diet.  If you have any abnormal symptoms including swelling  in your legs, shortness of breath, chest pain, nausea/vomiting you need to be seen immediately.     ED Prescriptions   None    PDMP not reviewed this encounter.   Jeani Hawking, PA-C 01/03/23 1031

## 2023-01-04 LAB — MICROALBUMIN / CREATININE URINE RATIO
Creatinine, Urine: 69.7 mg/dL
Microalb Creat Ratio: <4 mg/g{creat} (ref 0–29)
Microalb, Ur: <3 ug/mL — ABNORMAL HIGH

## 2023-01-15 ENCOUNTER — Other Ambulatory Visit (HOSPITAL_COMMUNITY): Payer: Self-pay

## 2023-05-30 LAB — GLUCOSE, POCT (MANUAL RESULT ENTRY): Glucose Fasting, POC: 75 mg/dL (ref 70–99)

## 2023-05-30 NOTE — Progress Notes (Signed)
 Pt screened for HTN, BS, SDOH needs. Community resources given to pt in regards to housing and food.

## 2023-06-09 NOTE — Congregational Nurse Program (Signed)
 Client to RN office after he was referred by Luwanna Sam. Client does not have any insurance, has completed a medicaid application but at this time not insured. He states he is need of vision, dental, and PCP. He is currently getting [facility] with [facility] [place]. RN then referred him to free clinic at the [facility] to help get connected with PCP Darol Elizabeth, and to get established with [facility] who can help him connected to vision and dental services. No further needs at this time.

## 2023-08-01 NOTE — Progress Notes (Unsigned)
 The patient attended a screening event on 05/30/2023 where his BP screening results was 136/79, Blood Glucose 75 fasting. At the event the patient documented non for insurance coverage. Patient documented he does not smoke tobacco and does not live with someone who smokes. Patient indicated he has food and housing SDOH insecuritiy community resources were given at the event. Pt did not list pcp.   Per chart review pt was last seen by Aloysius Ford, RN at the [facility] congregational nurse on 06/09/2023 after the pt attended the screening event. In the office visit on 06/09/2023 pt was assisted in the medicaid application, pt was also referred to [facility] clinic to established care with pcp Ronal Caldron Placey. At the visit pt was also assisted in establishing with the orange card to help him connect to vision and dental services. Patient is currently getting therapy with [facility] the [place].   Post event initial f/u CHW Attempted and unable to contact pt via phone and a vm was left on the mobile number. Letter sent with [facility] and [facility] PCP resource flyers, [place], [employer] and food and housing resources in case needed by pt. Additional pt f/u to be scheduled per health equity protocol.

## 2023-10-23 NOTE — Progress Notes (Signed)
 The patient attended a screening event on 05/30/2023 where his screening results were a BP of 136/79 and a blood glucose of 75. At the event the patient documented none for insurance, did not list a PCP, and is not a smoker. Patient indicated food and housing SDOH needs and patient was given resources at the time of the event.   Per initial f/u CHW was unable to reach patient via phone and left a vm. During 60 day f/u CHW contacted home number in chart and that number was not in service. A second attempt was made to the number provided at the event. Patient did not answer and CHW was unable to leave a vm at that time. Number listed on screening form was added to the patients chart. Letter and resources were mailed again.  Per chart review the patient was last seen by Aloysius Ford, RN a [facility] nurse on 06/09/2023. Patient was assisted with Medicaid application, referred to the [facility], and got assistance with orange card application to get access to additional services. Since that visit the patient has recently attended a few health education classes through [place]. Chart review also indicates the patient does not have a PCP, does not have insurance, and still has food and housing SDOH needs. An additional follow up will be done in according to the health equity team's protocol.

## 2024-01-20 ENCOUNTER — Encounter: Payer: Self-pay | Admitting: *Deleted

## 2024-01-20 NOTE — Progress Notes (Signed)
 Pt attended 05/30/2023 screening event where his BP was 136/79 and his blood sugar was 75. At the event the patient documented none for insurance, did not list a PCP, and noted not being a smoker. Patient indicated food and housing insecurities was given resources at the event.   Chart review indicates that pt saw Aloysius Ford, Congregational nurse, at the [facility] on 06/09/23, where she documented that pt saw SW who helped him completed a medicaid application since he was uninsured at that time and pt had stated that he was in need of vision, dental, and PCP services. She also noted that pt was getting therapy with [facility] of the [place] at that time. CNP then referred pt to free clinic at the [facility] to help get connected with PCP Ronal Jenkins Houseman NP, and to get established with [facility] which could help him get connected to vision and dental services   Additional chart review at 6 months f/u attempt indicates pt attended community education classes by [employer] at the [facility] and through the [employer] and Health Education in [place] on 08/26/23, 09/30/23, and 10/28/23. Historical address for pt during these encounters include apt address in [place], [facility] in [place], which pt listed at the time of the event, and [facility] in [place]   Per initial f/u CHW was unable to reach patient via phone and left a vm. During 60 day f/u CHW again attempted to contact pt via all known phone numbers but was unable to contact pt or leave a vm. Letter and resources were mailed again.  During the final 6 month attempt to reach the pt, health equity team member was unable to contact pt of leave a vm. In-basket sent to Congregational nurse last seen by pt and letters sent to pt at [facility] and event address listed. No additional health equity team f/u scheduled at this time.
